# Patient Record
Sex: Female | Born: 1950 | Race: Black or African American | Hispanic: No | Marital: Single | Smoking: Former smoker
Health system: Southern US, Community
[De-identification: ages and names within clinical notes are randomized; demographics above are authoritative.]

## PROBLEM LIST (undated history)

## (undated) DIAGNOSIS — M79604 Pain in right leg: Secondary | ICD-10-CM

## (undated) DIAGNOSIS — D120 Benign neoplasm of cecum: Secondary | ICD-10-CM

## (undated) DIAGNOSIS — G589 Mononeuropathy, unspecified: Secondary | ICD-10-CM

## (undated) DIAGNOSIS — S149XXA Injury of unspecified nerves of neck, initial encounter: Secondary | ICD-10-CM

## (undated) DIAGNOSIS — E119 Type 2 diabetes mellitus without complications: Secondary | ICD-10-CM

## (undated) DIAGNOSIS — I10 Essential (primary) hypertension: Secondary | ICD-10-CM

## (undated) DIAGNOSIS — Z1211 Encounter for screening for malignant neoplasm of colon: Secondary | ICD-10-CM

## (undated) DIAGNOSIS — Z972 Presence of dental prosthetic device (complete) (partial): Secondary | ICD-10-CM

## (undated) DIAGNOSIS — D125 Benign neoplasm of sigmoid colon: Secondary | ICD-10-CM

## (undated) DIAGNOSIS — E785 Hyperlipidemia, unspecified: Secondary | ICD-10-CM

## (undated) HISTORY — DX: Encounter for screening for malignant neoplasm of colon: Z12.11

## (undated) HISTORY — PX: ABDOMINAL HYSTERECTOMY: SHX81

## (undated) HISTORY — DX: Benign neoplasm of cecum: D12.0

## (undated) HISTORY — DX: Benign neoplasm of sigmoid colon: D12.5

## (undated) HISTORY — DX: Essential (primary) hypertension: I10

## (undated) HISTORY — DX: Type 2 diabetes mellitus without complications: E11.9

## (undated) HISTORY — DX: Hyperlipidemia, unspecified: E78.5

---

## 2007-04-23 ENCOUNTER — Ambulatory Visit: Payer: Self-pay | Admitting: Internal Medicine

## 2007-04-24 ENCOUNTER — Ambulatory Visit: Payer: Self-pay | Admitting: Gastroenterology

## 2009-12-14 ENCOUNTER — Ambulatory Visit: Payer: Self-pay | Admitting: Obstetrics and Gynecology

## 2010-08-20 HISTORY — PX: ROTATOR CUFF REPAIR: SHX139

## 2011-01-10 ENCOUNTER — Ambulatory Visit: Payer: Self-pay | Admitting: Obstetrics and Gynecology

## 2014-02-09 ENCOUNTER — Ambulatory Visit: Payer: Self-pay

## 2014-02-26 ENCOUNTER — Ambulatory Visit: Payer: Self-pay

## 2014-10-12 ENCOUNTER — Ambulatory Visit: Payer: Self-pay | Admitting: Family Medicine

## 2014-12-03 ENCOUNTER — Ambulatory Visit
Admit: 2014-12-03 | Disposition: A | Discharge status: Home / Self Care | Payer: Self-pay | Attending: Orthopedic Surgery | Admitting: Orthopedic Surgery

## 2015-09-22 ENCOUNTER — Other Ambulatory Visit: Payer: Self-pay | Admitting: Family Medicine

## 2015-09-22 DIAGNOSIS — Z1231 Encounter for screening mammogram for malignant neoplasm of breast: Secondary | ICD-10-CM

## 2015-10-17 ENCOUNTER — Ambulatory Visit
Admission: RE | Admit: 2015-10-17 | Discharge: 2015-10-17 | Disposition: A | Discharge status: Home / Self Care | Payer: Medicare Other | Source: Ambulatory Visit | Attending: Family Medicine | Admitting: Family Medicine

## 2015-10-17 DIAGNOSIS — Z1231 Encounter for screening mammogram for malignant neoplasm of breast: Secondary | ICD-10-CM | POA: Diagnosis present

## 2016-03-21 ENCOUNTER — Other Ambulatory Visit: Payer: Self-pay

## 2016-03-21 ENCOUNTER — Telehealth: Payer: Self-pay | Admitting: Gastroenterology

## 2016-03-21 NOTE — Telephone Encounter (Signed)
Gastroenterology Pre-Procedure Review  Request Date: 05/18/2016 Requesting Physician: Dr. Gwynneth Aliment  PATIENT REVIEW QUESTIONS: The patient responded to the following health history questions as indicated:    1. Are you having any GI issues? no 2. Do you have a personal history of Polyps? no 3. Do you have a family history of Colon Cancer or Polyps? no 4. Diabetes Mellitus? no 5. Joint replacements in the past 12 months?no 6. Major health problems in the past 3 months?no 7. Any artificial heart valves, MVP, or defibrillator?no    MEDICATIONS & ALLERGIES:    Patient reports the following regarding taking any anticoagulation/antiplatelet therapy:   Plavix, Coumadin, Eliquis, Xarelto, Lovenox, Pradaxa, Brilinta, or Effient? no Aspirin? no  Patient confirms/reports the following medications:  Current Outpatient Prescriptions  Medication Sig Dispense Refill  . Blood Glucose Monitoring Suppl (ACCU-CHEK AVIVA PLUS) w/Device KIT TEST BLOOD SUGAR AS DIRECTED.  0  . hydrochlorothiazide (HYDRODIURIL) 25 MG tablet Take 25 mg by mouth daily.    [name] JANUVIA 50 MG tablet TK 1 T PO QAM  0  . meclizine (ANTIVERT) 25 MG tablet Take 25 mg by mouth 3 (three) times daily as needed for dizziness.    . meloxicam (MOBIC) 15 MG tablet Take 15 mg by mouth daily.    . simvastatin (ZOCOR) 10 MG tablet TK 1 T PO QAM  0  . ibuprofen (ADVIL,MOTRIN) 800 MG tablet TK 1 T PO Q 8 H WF PRF PAIN  0  . lisinopril (PRINIVIL,ZESTRIL) 10 MG tablet TK 1 T PO  QAM  0  . Vitamin D, Ergocalciferol, (DRISDOL) 50000 units CAPS capsule TK ONE C PO Q WEEK  3   No current facility-administered medications for this visit.     Patient confirms/reports the following allergies:  Allergies  Allergen Reactions  . Codeine Nausea And Vomiting    No orders of the defined types were placed in this encounter.   AUTHORIZATION INFORMATION Primary Insurance: 1D#: Group #:  Secondary Insurance: 1D#: Group #:  SCHEDULE  INFORMATION: Date: 05/18/2016 Time: Location: [facility]

## 2016-03-21 NOTE — Telephone Encounter (Signed)
colonoscopy

## 2016-03-21 NOTE — Telephone Encounter (Signed)
Screening Colonoscopy Z12.11 [facility] [id number] Please pre cert

## 2016-04-13 ENCOUNTER — Encounter: Payer: Medicare Other | Attending: Obstetrics and Gynecology | Admitting: *Deleted

## 2016-04-13 ENCOUNTER — Encounter: Payer: Self-pay | Admitting: *Deleted

## 2016-04-13 VITALS — BP 148/90 | Ht 64.0 in | Wt 180.6 lb

## 2016-04-13 DIAGNOSIS — Z713 Dietary counseling and surveillance: Secondary | ICD-10-CM | POA: Insufficient documentation

## 2016-04-13 DIAGNOSIS — E119 Type 2 diabetes mellitus without complications: Secondary | ICD-10-CM

## 2016-04-13 DIAGNOSIS — Z6831 Body mass index (BMI) 31.0-31.9, adult: Secondary | ICD-10-CM | POA: Insufficient documentation

## 2016-04-13 NOTE — Progress Notes (Signed)
Diabetes Self-Management Education  Visit Type: First/Initial  Appt. Start Time: 1400 Appt. End Time: 1500  04/13/2016  [name], identified by name and date of birth, is a 65 y.o. female with a diagnosis of Diabetes: Type 2.   ASSESSMENT  Blood pressure (!) 148/90, height 5\' 4"  (1.626 m), weight 180 lb 9.6 oz (81.9 kg). Body mass index is 31 kg/m.      Diabetes Self-Management Education - 04/13/16 1523      Visit Information   Visit Type First/Initial     Initial Visit   Diabetes Type Type 2   Are you currently following a meal plan? Yes   What type of meal plan do you follow? "no soda, no starchy foods, only no sugar wheat bread"   Are you taking your medications as prescribed? No  Pt reports that she has "diabetic" symptoms when taking diabetes medications. She is not taking Januvia that is on her medication list.    Date Diagnosed Pt reports diabetes was on her chart years ago but she had them remove as diagnosis.      Health Coping   How would you rate your overall health? Good     Psychosocial Assessment   Patient Belief/Attitude about Diabetes Denial  "I am determined I am not diabetic"   Self-care barriers None   Self-management support Doctor's office;Family   Patient Concerns Nutrition/Meal planning;Medication;Glycemic Control;Problem Solving;Monitoring;Healthy Lifestyle;Weight Control   Special Needs None   Preferred Learning Style Visual   Learning Readiness Change in progress   How often do you need to have someone help you when you read instructions, pamphlets, or other written materials from your doctor or pharmacy? 1 - Never   What is the last grade level you completed in school? 9th grade but is currently getting her GED     Pre-Education Assessment   Patient understands the diabetes disease and treatment process. Needs Instruction   Patient understands incorporating nutritional management into lifestyle. Needs Instruction   Patient undertands  incorporating physical activity into lifestyle. Needs Instruction   Patient understands using medications safely. Needs Instruction   Patient understands monitoring blood glucose, interpreting and using results Needs Review   Patient understands prevention, detection, and treatment of acute complications. Needs Instruction   Patient understands prevention, detection, and treatment of chronic complications. Needs Instruction   Patient understands how to develop strategies to address psychosocial issues. Needs Instruction   Patient understands how to develop strategies to promote health/change behavior. Needs Instruction     Complications   How often do you check your blood sugar? 1-2 times/day   Postprandial Blood glucose range (mg/dL) 130-179;180-200   Have you had a dilated eye exam in the past 12 months? Yes   Have you had a dental exam in the past 12 months? Yes   Are you checking your feet? No     Dietary Intake   Breakfast oatmeal, 1/2 banana or egg   Snack (morning) cheese nabs   Lunch baked chicken on fish, 3 bean salad, squash, green beans   Dinner same as lunch   Beverage(s) water, unsweetened tea, coffee     Exercise   Exercise Type Light (walking / raking leaves)   How many days per week to you exercise? 3   How many minutes per day do you exercise? 30   Total minutes per week of exercise 90     Patient Education   Previous Diabetes Education No   Disease state  Factors  that contribute to the development of diabetes   Nutrition management  Role of diet in the treatment of diabetes and the relationship between the three main macronutrients and blood glucose level   Physical activity and exercise  Role of exercise on diabetes management, blood pressure control and cardiac health.   Monitoring Purpose and frequency of SMBG.;Identified appropriate SMBG and/or A1C goals.   Chronic complications Relationship between chronic complications and blood glucose control    Psychosocial adjustment Identified and addressed patients feelings and concerns about diabetes     Individualized Goals (developed by patient)   Reducing Risk Improve blood sugars Decrease medications Prevent diabetes complications Lose weight Lead a healthier lifestyle     Outcomes   Expected Outcomes Demonstrated interest in learning. Expect positive outcomes   Future DMSE 2 wks      Individualized Plan for Diabetes Self-Management Training:   Learning Objective:  Patient will have a greater understanding of diabetes self-management. Patient education plan is to attend individual and/or group sessions per assessed needs and concerns.   Plan:   Patient Instructions  Check blood sugars 2 x day before breakfast and 2 hrs after supper every day Exercise: Continue walking for 30 minutes  3 days a week and increase to 5 days a week as tolerated Eat 3 meals day,  1-2  snacks a day Space meals 4-6 hours apart Don't skip meals Complete 3 Day Food Record and bring to next appt Bring blood sugar records to the next appointment Return for appointment on:  Tuesday May 01, 2016 at 10:30 am with [facility] (dietitian)   Expected Outcomes:  Demonstrated interest in learning. Expect positive outcomes  Education material provided:  General Meal Planning Guidelines Simple Meal Plan 3 Day Food Record  If problems or questions, patient to contact team via:  Johny Drilling, RN, [place], CDE [phone]  Future DSME appointment: 2 wks  Tuesday May 01, 2016 with dietitian

## 2016-04-13 NOTE — Patient Instructions (Addendum)
Check blood sugars 2 x day before breakfast and 2 hrs after supper every day  Exercise: Continue walking for 30 minutes  3 days a week and increase to 5 days a week as tolerated  Eat 3 meals day,  1-2  snacks a day Space meals 4-6 hours apart Don't skip meals Complete 3 Day Food Record and bring to next appt  Bring blood sugar records to the next appointment  Return for appointment on:  Tuesday May 01, 2016 at 10:30 am with [facility] (dietitian)

## 2016-05-01 ENCOUNTER — Ambulatory Visit: Payer: Medicare Other | Admitting: Dietician

## 2016-05-08 ENCOUNTER — Ambulatory Visit: Payer: Medicare Other | Admitting: Dietician

## 2016-05-10 ENCOUNTER — Encounter: Payer: Self-pay | Admitting: *Deleted

## 2016-05-14 ENCOUNTER — Encounter: Payer: Medicare Other | Attending: Obstetrics and Gynecology | Admitting: Dietician

## 2016-05-14 ENCOUNTER — Encounter: Payer: Self-pay | Admitting: Dietician

## 2016-05-14 VITALS — BP 136/90 | Ht 64.0 in | Wt 178.0 lb

## 2016-05-14 DIAGNOSIS — Z6831 Body mass index (BMI) 31.0-31.9, adult: Secondary | ICD-10-CM | POA: Diagnosis not present

## 2016-05-14 DIAGNOSIS — Z713 Dietary counseling and surveillance: Secondary | ICD-10-CM | POA: Diagnosis present

## 2016-05-14 DIAGNOSIS — E119 Type 2 diabetes mellitus without complications: Secondary | ICD-10-CM | POA: Diagnosis not present

## 2016-05-14 NOTE — Patient Instructions (Signed)
   Try to include 2 servings of healthy carbohydrate with each meal.   Eat something at least every 5 hours during the day.   You are doing a great job eating vegetables and lean protein foods, keep it up!  Also keep up your regular exercise.   Remember that not enough sleep, and pain can both make your blood sugar go up, so doing what you can to get enough sleep and control your pain will help keep blood sugar in control.

## 2016-05-14 NOTE — Progress Notes (Signed)
Diabetes Self-Management Education  Visit Type:  Follow-up  Appt. Start Time: 1330 Appt. End Time: 1440  05/14/2016  [name], identified by name and date of birth, is a 65 y.o. female with a diagnosis of Diabetes:  .   ASSESSMENT  Blood pressure 136/90, height 5\' 4"  (1.626 m), weight 178 lb (80.7 kg). Body mass index is 30.55 kg/m.       Diabetes Self-Management Education - [id number]      Complications   How often do you check your blood sugar? 1-2 times/day  2-3 times a day   Fasting Blood glucose range (mg/dL) 70-129;130-179   Postprandial Blood glucose range (mg/dL) 130-179;70-129   Have you had a dilated eye exam in the past 12 months? Yes   Have you had a dental exam in the past 12 months? Yes   Are you checking your feet? Yes   How many days per week are you checking your feet? 7     Dietary Intake   Breakfast 2 meals and 1-3 snacks daily     Exercise   Exercise Type Light (walking / raking leaves)   How many days per week to you exercise? 3   How many minutes per day do you exercise? 30   Total minutes per week of exercise 90     Patient Education   Nutrition management  Role of diet in the treatment of diabetes and the relationship between the three main macronutrients and blood glucose level;Food label reading, portion sizes and measuring food.;Meal timing in regards to the patients' current diabetes medication.;Meal options for control of blood glucose level and chronic complications.   Physical activity and exercise  Role of exercise on diabetes management, blood pressure control and cardiac health.   Monitoring Taught/discussed recording of test results and interpretation of SMBG.   Psychosocial adjustment Role of stress on diabetes     Post-Education Assessment   Patient understands the diabetes disease and treatment process. Demonstrates understanding / competency   Patient understands incorporating nutritional management into lifestyle. Needs  Review   Patient undertands incorporating physical activity into lifestyle. Demonstrates understanding / competency   Patient understands using medications safely. Needs Review  needs reinforcement   Patient understands monitoring blood glucose, interpreting and using results Demonstrates understanding / competency   Patient understands prevention, detection, and treatment of acute complications. Needs Review   Patient understands prevention, detection, and treatment of chronic complications. Needs Review   Patient understands how to develop strategies to address psychosocial issues. Needs Review   Patient understands how to develop strategies to promote health/change behavior. Needs Review     Outcomes   Program Status Completed      Learning Objective:  Patient will have a greater understanding of diabetes self-management. Patient education plan is to attend individual and/or group sessions per assessed needs and concerns.  Patient has been inconsistent with carbohydrate intake in effort to avoid carbohydrate with some meals. Advised her to include at least 2 carb servings, or 30g, with each meal.  She has also stopped taking most meds because she feels they are giving her side effects, and wants to avoid taking meds if possible. She is taking Simvastatin, B12, and Vitamin D, and Meclizine as needed. Plan:   Patient Instructions   Try to include 2 servings of healthy carbohydrate with each meal.   Eat something at least every 5 hours during the day.   You are doing a great job eating vegetables and lean  protein foods, keep it up!  Also keep up your regular exercise.   Remember that not enough sleep, and pain can both make your blood sugar go up, so doing what you can to get enough sleep and control your pain will help keep blood sugar in control.    Expected Outcomes:  Demonstrated interest in learning. Expect positive outcomes  Education material provided: personal Food  record with adequate healthy carbs for balanced meal options.   If problems or questions, patient to contact team via:  Phone

## 2016-05-15 NOTE — Discharge Instructions (Signed)

## 2016-05-17 ENCOUNTER — Ambulatory Visit: Payer: Medicare Other | Admitting: Anesthesiology

## 2016-05-17 ENCOUNTER — Encounter: Payer: Self-pay | Admitting: *Deleted

## 2016-05-17 ENCOUNTER — Ambulatory Visit: Payer: Medicare Other | Admitting: Dietician

## 2016-05-17 ENCOUNTER — Ambulatory Visit
Admission: RE | Admit: 2016-05-17 | Discharge: 2016-05-17 | Disposition: A | Discharge status: Home / Self Care | Payer: Medicare Other | Source: Ambulatory Visit | Attending: Gastroenterology | Admitting: Gastroenterology

## 2016-05-17 ENCOUNTER — Encounter
Admission: RE | Disposition: A | Discharge status: Home / Self Care | Payer: Self-pay | Source: Ambulatory Visit | Attending: Gastroenterology

## 2016-05-17 DIAGNOSIS — K641 Second degree hemorrhoids: Secondary | ICD-10-CM | POA: Diagnosis not present

## 2016-05-17 DIAGNOSIS — Z7984 Long term (current) use of oral hypoglycemic drugs: Secondary | ICD-10-CM | POA: Diagnosis not present

## 2016-05-17 DIAGNOSIS — Z833 Family history of diabetes mellitus: Secondary | ICD-10-CM | POA: Diagnosis not present

## 2016-05-17 DIAGNOSIS — I10 Essential (primary) hypertension: Secondary | ICD-10-CM | POA: Insufficient documentation

## 2016-05-17 DIAGNOSIS — Z90710 Acquired absence of both cervix and uterus: Secondary | ICD-10-CM | POA: Diagnosis not present

## 2016-05-17 DIAGNOSIS — Z885 Allergy status to narcotic agent status: Secondary | ICD-10-CM | POA: Insufficient documentation

## 2016-05-17 DIAGNOSIS — Z87891 Personal history of nicotine dependence: Secondary | ICD-10-CM | POA: Insufficient documentation

## 2016-05-17 DIAGNOSIS — E119 Type 2 diabetes mellitus without complications: Secondary | ICD-10-CM | POA: Diagnosis not present

## 2016-05-17 DIAGNOSIS — Z79899 Other long term (current) drug therapy: Secondary | ICD-10-CM | POA: Insufficient documentation

## 2016-05-17 DIAGNOSIS — D120 Benign neoplasm of cecum: Secondary | ICD-10-CM

## 2016-05-17 DIAGNOSIS — Z6829 Body mass index (BMI) 29.0-29.9, adult: Secondary | ICD-10-CM | POA: Insufficient documentation

## 2016-05-17 DIAGNOSIS — G588 Other specified mononeuropathies: Secondary | ICD-10-CM | POA: Diagnosis not present

## 2016-05-17 DIAGNOSIS — M79604 Pain in right leg: Secondary | ICD-10-CM | POA: Insufficient documentation

## 2016-05-17 DIAGNOSIS — E785 Hyperlipidemia, unspecified: Secondary | ICD-10-CM | POA: Insufficient documentation

## 2016-05-17 DIAGNOSIS — D125 Benign neoplasm of sigmoid colon: Secondary | ICD-10-CM

## 2016-05-17 DIAGNOSIS — Z1211 Encounter for screening for malignant neoplasm of colon: Secondary | ICD-10-CM

## 2016-05-17 HISTORY — DX: Injury of unspecified nerves of neck, initial encounter: S14.9XXA

## 2016-05-17 HISTORY — DX: Pain in right leg: M79.604

## 2016-05-17 HISTORY — PX: COLONOSCOPY WITH PROPOFOL: SHX5780

## 2016-05-17 HISTORY — DX: Presence of dental prosthetic device (complete) (partial): Z97.2

## 2016-05-17 HISTORY — DX: Mononeuropathy, unspecified: G58.9

## 2016-05-17 HISTORY — PX: POLYPECTOMY: SHX5525

## 2016-05-17 SURGERY — COLONOSCOPY WITH PROPOFOL
Anesthesia: Monitor Anesthesia Care | Wound class: Contaminated

## 2016-05-17 MED ORDER — LACTATED RINGERS IV SOLN
INTRAVENOUS | Status: DC
  Administered 2016-05-17: 10:00:00 via INTRAVENOUS

## 2016-05-17 MED ORDER — SODIUM CHLORIDE 0.9 % IV SOLN: INTRAVENOUS | Status: DC

## 2016-05-17 MED ORDER — STERILE WATER FOR IRRIGATION IR SOLN
Status: DC | PRN
  Administered 2016-05-17: 12:00:00

## 2016-05-17 MED ORDER — LIDOCAINE HCL (CARDIAC) 20 MG/ML IV SOLN
INTRAVENOUS | Status: DC | PRN
  Administered 2016-05-17: 50 mg via INTRAVENOUS

## 2016-05-17 MED ORDER — PROPOFOL 10 MG/ML IV BOLUS
INTRAVENOUS | Status: DC | PRN
  Administered 2016-05-17: 20 mg via INTRAVENOUS
  Administered 2016-05-17: 50 mg via INTRAVENOUS
  Administered 2016-05-17 (×2): 30 mg via INTRAVENOUS
  Administered 2016-05-17: 20 mg via INTRAVENOUS

## 2016-05-17 SURGICAL SUPPLY — 23 items
CANISTER SUCT 1200ML W/VALVE (MISCELLANEOUS) ×3 IMPLANT
CLIP HMST 235XBRD CATH ROT (MISCELLANEOUS) IMPLANT
CLIP RESOLUTION 360 11X235 (MISCELLANEOUS)
FCP ESCP3.2XJMB 240X2.8X (MISCELLANEOUS)
FORCEPS BIOP RAD 4 LRG CAP 4 (CUTTING FORCEPS) ×3 IMPLANT
FORCEPS BIOP RJ4 240 W/NDL (MISCELLANEOUS)
FORCEPS ESCP3.2XJMB 240X2.8X (MISCELLANEOUS) IMPLANT
GOWN CVR UNV OPN BCK APRN NK (MISCELLANEOUS) ×4 IMPLANT
GOWN ISOL THUMB LOOP REG UNIV (MISCELLANEOUS) ×2
INJECTOR VARIJECT VIN23 (MISCELLANEOUS) IMPLANT
KIT DEFENDO VALVE AND CONN (KITS) IMPLANT
KIT ENDO PROCEDURE OLY (KITS) ×3 IMPLANT
MARKER SPOT ENDO TATTOO 5ML (MISCELLANEOUS) IMPLANT
PAD GROUND ADULT SPLIT (MISCELLANEOUS) IMPLANT
PROBE APC STR FIRE (PROBE) IMPLANT
RETRIEVER NET ROTH 2.5X230 LF (MISCELLANEOUS) IMPLANT
SNARE SHORT THROW 13M SML OVAL (MISCELLANEOUS) ×3 IMPLANT
SNARE SHORT THROW 30M LRG OVAL (MISCELLANEOUS) IMPLANT
SNARE SNG USE RND 15MM (INSTRUMENTS) IMPLANT
SPOT EX ENDOSCOPIC TATTOO (MISCELLANEOUS)
TRAP ETRAP POLY (MISCELLANEOUS) ×3 IMPLANT
VARIJECT INJECTOR VIN23 (MISCELLANEOUS)
WATER STERILE IRR 250ML POUR (IV SOLUTION) ×3 IMPLANT

## 2016-05-17 NOTE — Anesthesia Postprocedure Evaluation (Signed)
Anesthesia Post Note  Patient: Valerie Prince  Procedure(s) Performed: Procedure(s) (LRB): COLONOSCOPY WITH PROPOFOL (N/A) POLYPECTOMY  Patient location during evaluation: PACU Anesthesia Type: MAC Level of consciousness: awake and alert Pain management: pain level controlled Vital Signs Assessment: post-procedure vital signs reviewed and stable Respiratory status: spontaneous breathing, nonlabored ventilation, respiratory function stable and patient connected to nasal cannula oxygen Cardiovascular status: stable and blood pressure returned to baseline Anesthetic complications: no    Cheick Suhr

## 2016-05-17 NOTE — H&P (Signed)
Valerie Lame, MD [facility] [address], [place], [place] [address] Phone: [phone] Fax : [phone]  Primary Care Physician:  Elgie Collard, MD Primary Gastroenterologist:  Dr. Allen Norris  Pre-Procedure History & Physical: HPI:  Valerie Prince is a 65 y.o. female is here for a screening colonoscopy.   Past Medical History:  Diagnosis Date  . Diabetes mellitus without complication ([place])    pt denies  . Hyperlipidemia   . Hypertension    pt denies  . Pinched nerve in neck   . Right leg pain    intermittent  . Wears dentures    upper partial    Past Surgical History:  Procedure Laterality Date  . ABDOMINAL HYSTERECTOMY    . ROTATOR CUFF REPAIR Left 2012    Prior to Admission medications   Medication Sig Start Date End Date Taking? Authorizing Provider  azelaic acid (AZELEX) 20 % cream Apply 1 application topically at bedtime. 10/26/15  Yes Historical Provider, MD  ibuprofen (ADVIL,MOTRIN) 800 MG tablet TK 1 T PO Q 8 H WF PRF PAIN 12/20/15  Yes Historical Provider, MD  meclizine (ANTIVERT) 25 MG tablet Take 25 mg by mouth 3 (three) times daily as needed for dizziness.   Yes Historical Provider, MD  simvastatin (ZOCOR) 10 MG tablet TK 1 T PO QAM 02/28/16  Yes Historical Provider, MD  vitamin B-12 (CYANOCOBALAMIN) 1000 MCG tablet Take 1,000 mcg by mouth daily.   Yes Historical Provider, MD  Vitamin D, Ergocalciferol, (DRISDOL) 50000 units CAPS capsule TK ONE C PO Q WEEK 02/28/16  Yes Historical Provider, MD  Blood Glucose Monitoring Suppl (ACCU-CHEK AVIVA PLUS) w/Device KIT TEST BLOOD SUGAR AS DIRECTED. 02/28/16   Historical Provider, MD  cyclobenzaprine (FLEXERIL) 10 MG tablet Take 10 mg by mouth 3 (three) times daily as needed. 01/21/16   Historical Provider, MD  hydrochlorothiazide (HYDRODIURIL) 25 MG tablet Take 25 mg by mouth daily as needed.     Historical Provider, MD  JANUVIA 50 MG tablet TK 1 T PO QAM 03/01/16   Historical Provider, MD  lisinopril (PRINIVIL,ZESTRIL) 10  MG tablet TK 1 T PO  QAM 02/28/16   Historical Provider, MD  meloxicam (MOBIC) 15 MG tablet Take 15 mg by mouth daily as needed.     Historical Provider, MD    Allergies as of 03/21/2016 - Review Complete 03/21/2016  Allergen Reaction Noted  . Codeine Nausea And Vomiting 03/21/2016    Family History  Problem Relation Age of Onset  . Diabetes Sister     Social History   Social History  . Marital status: Married    Spouse name: N/A  . Number of children: N/A  . Years of education: N/A   Occupational History  . Not on file.   Social History Main Topics  . Smoking status: Former Smoker    Packs/day: 0.50    Years: 32.00    Types: Cigarettes    Quit date: 03/20/2006  . Smokeless tobacco: Never Used  . Alcohol use Yes     Comment: 3 x year - wine  . Drug use: Unknown  . Sexual activity: Not on file   Other Topics Concern  . Not on file   Social History Narrative  . No narrative on file    Review of Systems: See HPI, otherwise negative ROS  Physical Exam: BP 130/87   Pulse (!) 102   Temp 97.4 F (36.3 C) (Core (Comment))   Resp 20   Ht _0  (1.626 m)  Wt 173 lb (78.5 kg)   SpO2 100%   BMI 29.70 kg/m  General:   Alert,  pleasant and cooperative in NAD Head:  Normocephalic and atraumatic. Neck:  Supple; no masses or thyromegaly. Lungs:  Clear throughout to auscultation.    Heart:  Regular rate and rhythm. Abdomen:  Soft, nontender and nondistended. Normal bowel sounds, without guarding, and without rebound.   Neurologic:  Alert and  oriented x4;  grossly normal neurologically.  Impression/Plan: Valerie Prince is now here to undergo a screening colonoscopy.  Risks, benefits, and alternatives regarding colonoscopy have been reviewed with the patient.  Questions have been answered.  All parties agreeable.

## 2016-05-17 NOTE — Anesthesia Procedure Notes (Signed)
Procedure Name: MAC Performed by: Izaias Krupka Pre-anesthesia Checklist: Patient identified, Emergency Drugs available, Suction available, Timeout performed and Patient being monitored Patient Re-evaluated:Patient Re-evaluated prior to inductionOxygen Delivery Method: Nasal cannula Placement Confirmation: positive ETCO2       

## 2016-05-17 NOTE — Anesthesia Preprocedure Evaluation (Signed)
Anesthesia Evaluation  Patient identified by MRN, date of birth, ID band  Reviewed: NPO status   History of Anesthesia Complications Negative for: history of anesthetic complications  Airway Mallampati: II  TM Distance: >3 FB Neck ROM: full    Dental  (+) Upper Dentures   Pulmonary neg pulmonary ROS, former smoker,    Pulmonary exam normal        Cardiovascular Exercise Tolerance: Good hypertension, Normal cardiovascular exam  lipids   Neuro/Psych negative neurological ROS  negative psych ROS   GI/Hepatic negative GI ROS, Neg liver ROS,   Endo/Other  diabetes (diet ctrl)Morbid obesity (bmi=31)  Renal/GU negative Renal ROS  negative genitourinary   Musculoskeletal   Abdominal   Peds  Hematology negative hematology ROS (+)   Anesthesia Other Findings   Reproductive/Obstetrics                             Anesthesia Physical Anesthesia Plan  ASA: II  Anesthesia Plan: MAC   Post-op Pain Management:    Induction:   Airway Management Planned:   Additional Equipment:   Intra-op Plan:   Post-operative Plan:   Informed Consent: I have reviewed the patients History and Physical, chart, labs and discussed the procedure including the risks, benefits and alternatives for the proposed anesthesia with the patient or authorized representative who has indicated his/her understanding and acceptance.     Plan Discussed with: CRNA  Anesthesia Plan Comments:         Anesthesia Quick Evaluation

## 2016-05-17 NOTE — Transfer of Care (Signed)
Immediate Anesthesia Transfer of Care Note  Patient: Valerie Prince  Procedure(s) Performed: Procedure(s) with comments: COLONOSCOPY WITH PROPOFOL (N/A) - PLEASE KEEP PT ARRIVAL TIME 10 OR AFTER POLYPECTOMY  Patient Location: PACU  Anesthesia Type: MAC  Level of Consciousness: awake, alert  and patient cooperative  Airway and Oxygen Therapy: Patient Spontanous Breathing and Patient connected to supplemental oxygen  Post-op Assessment: Post-op Vital signs reviewed, Patient's Cardiovascular Status Stable, Respiratory Function Stable, Patent Airway and No signs of Nausea or vomiting  Post-op Vital Signs: Reviewed and stable  Complications: No apparent anesthesia complications

## 2016-05-17 NOTE — Op Note (Signed)
[facility]astro[facility] Patient Name: Valerie Prince Procedure Date: 05/17/2016 11:18 AM MRN: [id number] Account #: [id number] Date of Birth: 08-25-1950 Admit Type: Outpatient Age: 65 Room: [facility] OR ROOM 01 Gender: Female Note Status: Finalized Procedure:            Colonoscopy Indications:          Screening for colorectal malignant neoplasm Providers:            [name] MD, MD Referring MD:         Shelby Mattocks. Georga Bora, MD (Referring MD) Medicines:            Propofol per Anesthesia Complications:        No immediate complications. Procedure:            Pre-Anesthesia Assessment:                       - Prior to the procedure, a History and Physical was                        performed, and patient medications and allergies were                        reviewed. The patient's tolerance of previous                        anesthesia was also reviewed. The risks and benefits of                        the procedure and the sedation options and risks were                        discussed with the patient. All questions were                        answered, and informed consent was obtained. Prior                        Anticoagulants: The patient has taken no previous                        anticoagulant or antiplatelet agents. ASA Grade                        Assessment: II - A patient with mild systemic disease.                        After reviewing the risks and benefits, the patient was                        deemed in satisfactory condition to undergo the                        procedure.                       After obtaining informed consent, the colonoscope was                        passed under direct vision. Throughout the procedure,  the patient's blood pressure, pulse, and oxygen                        saturations were monitored continuously. The Olympus CF                        H180AL colonoscope (S#: [id number]) was  introduced through                        the anus and advanced to the the cecum, identified by                        appendiceal orifice and ileocecal valve. The                        colonoscopy was performed without difficulty. The                        patient tolerated the procedure well. The quality of                        the bowel preparation was excellent. Findings:      The perianal and digital rectal examinations were normal.      A 4 mm polyp was found in the cecum. The polyp was sessile. The polyp       was removed with a cold biopsy forceps. Resection and retrieval were       complete.      A 6 mm polyp was found in the sigmoid colon. The polyp was sessile. The       polyp was removed with a cold snare. Resection and retrieval were       complete.      Non-bleeding internal hemorrhoids were found during retroflexion. The       hemorrhoids were Grade II (internal hemorrhoids that prolapse but reduce       spontaneously). Impression:           - One 4 mm polyp in the cecum, removed with a cold                        biopsy forceps. Resected and retrieved.                       - One 6 mm polyp in the sigmoid colon, removed with a                        cold snare. Resected and retrieved.                       - Non-bleeding internal hemorrhoids. Recommendation:       - Discharge patient to home.                       - Resume previous diet.                       - Continue present medications.                       - Await pathology results.                       -  Repeat colonoscopy in 5 years if polyp adenoma and 10                        years if hyperplastic Procedure Code(s):    --- Professional ---                       [phone], Colonoscopy, flexible; with removal of tumor(s),                        polyp(s), or other lesion(s) by snare technique                       45380, 65, Colonoscopy, flexible; with biopsy, single                        or  multiple Diagnosis Code(s):    --- Professional ---                       Z12.11, Encounter for screening for malignant neoplasm                        of colon                       D12.0, Benign neoplasm of cecum                       D12.5, Benign neoplasm of sigmoid colon CPT copyright 2016 American Medical Association. All rights reserved. The codes documented in this report are preliminary and upon coder review may  be revised to meet current compliance requirements. [name] MD, MD 05/17/2016 11:44:03 AM This report has been signed electronically. Number of Addenda: 0 Note Initiated On: 05/17/2016 11:18 AM Scope Withdrawal Time: 0 hours 6 minutes 26 seconds  Total Procedure Duration: 0 hours 10 minutes 32 seconds       [facility]

## 2016-05-18 ENCOUNTER — Encounter: Payer: Self-pay | Admitting: Gastroenterology

## 2016-05-24 ENCOUNTER — Encounter: Payer: Self-pay | Admitting: Gastroenterology

## 2016-05-29 ENCOUNTER — Encounter: Payer: Self-pay | Admitting: Gastroenterology

## 2017-02-23 IMAGING — MR MRI LUMBAR SPINE WITHOUT CONTRAST
5 series · 37 of 48 positions shown · non-contrast
Comparison: Lumbar radiographs 10/12/2014.

CLINICAL DATA: Low back pain. Radiation to the RIGHT leg. Symptoms
for over 1 year.

EXAM:
MRI LUMBAR SPINE WITHOUT CONTRAST
TECHNIQUE: Multiplanar, multisequence MR imaging of the lumbar spine was
performed. No intravenous contrast was administered.

[Series 2: T2 · sagittal · 4.0mm · 0.81mm/px · 6 of 15 slices shown (1 of 2)]
[im 1/15]
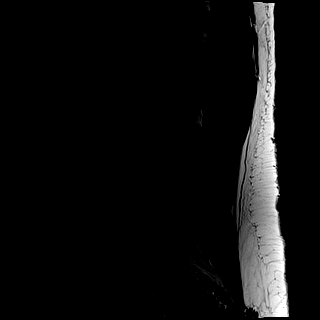
[im 3/15]
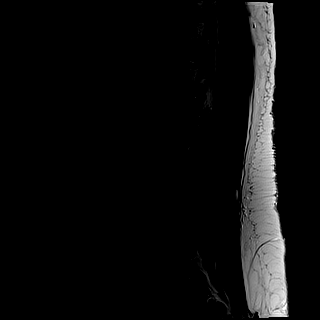
[im 6/15]
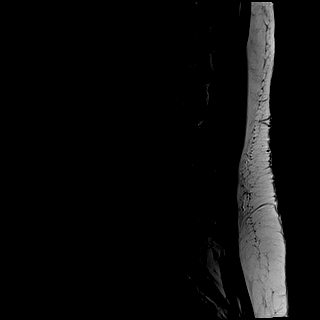
[im 9/15]
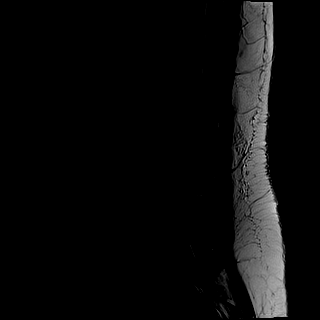
[im 12/15]
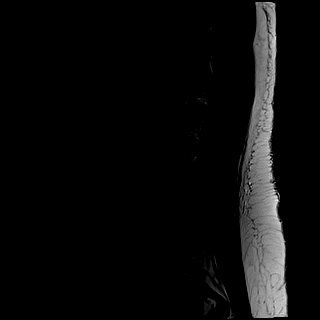
[im 15/15]
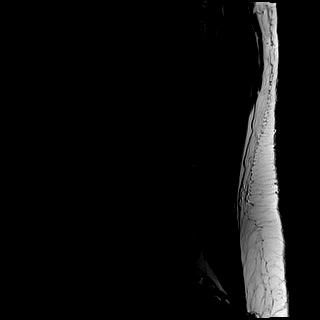

[Series 3: T1 · sagittal · 4.0mm · 0.81mm/px · 6 of 15 slices shown (1 of 2)]
[im 1/15]
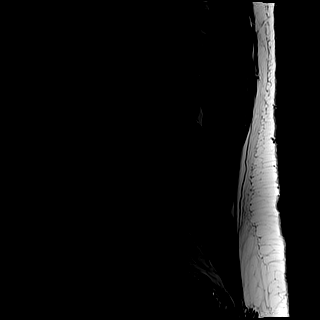
[im 3/15]
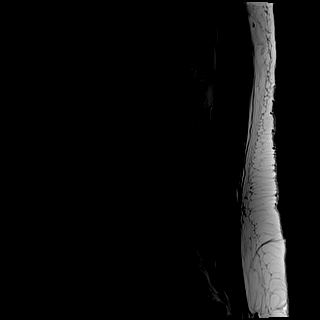
[im 6/15]
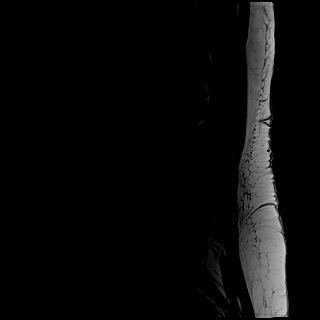
[im 9/15]
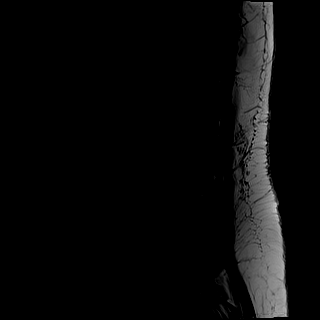
[im 12/15]
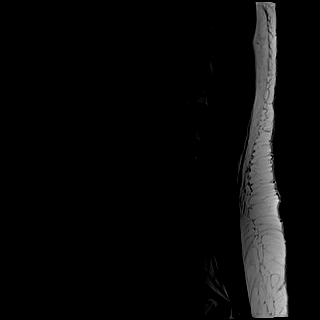
[im 15/15]
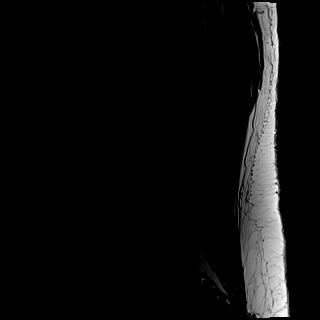

[Series 4: STIR · sagittal · 4.0mm · 1.02mm/px · 6 of 15 slices shown]
[im 1/15]
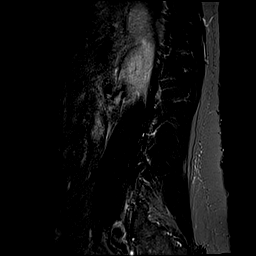
[im 3/15]
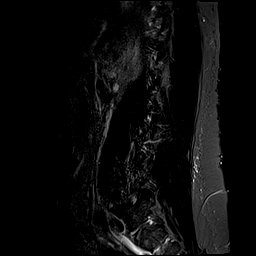
[im 6/15]
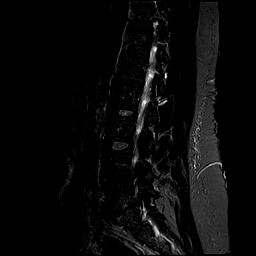
[im 9/15]
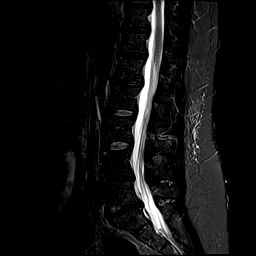
[im 12/15]
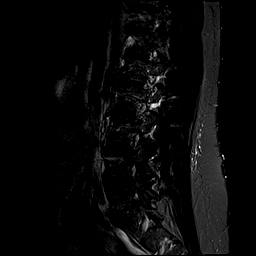
[im 15/15]
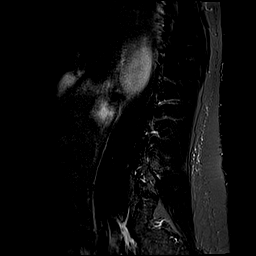

[Series 5: T2 · axial · 4.0mm · 0.78mm/px · z∈[-36,+157]mm · 10 of 39 slices shown (2 of 2)]
[im 1/39]
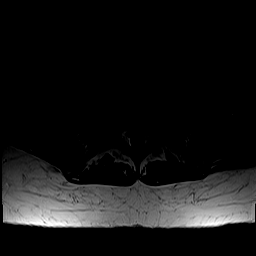
[im 3/39]
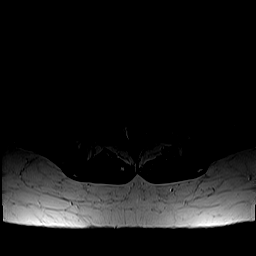
[im 6/39]
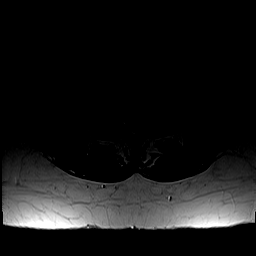
[im 11/39]
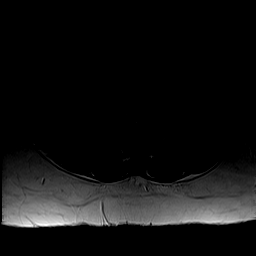
[im 17/39]
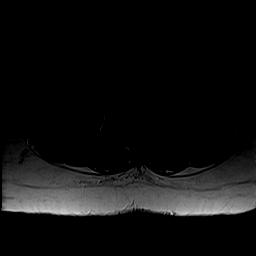
[im 20/39]
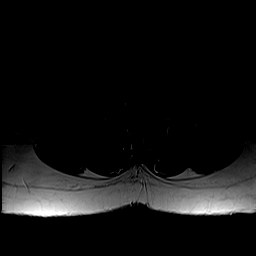
[im 22/39]
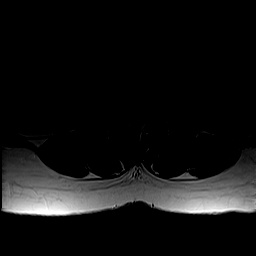
[im 28/39]
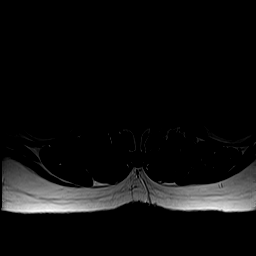
[im 33/39]
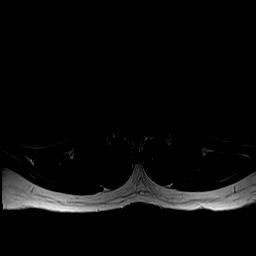
[im 39/39]
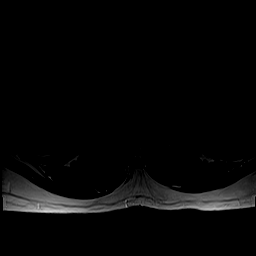

[Series 6: T1 · axial · 4.0mm · 0.39mm/px · z∈[-36,+157]mm · 9 of 39 slices shown (2 of 2)]
[im 1/39]
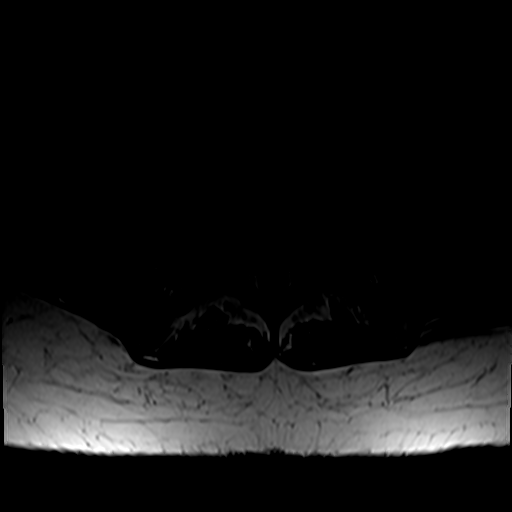
[im 6/39]
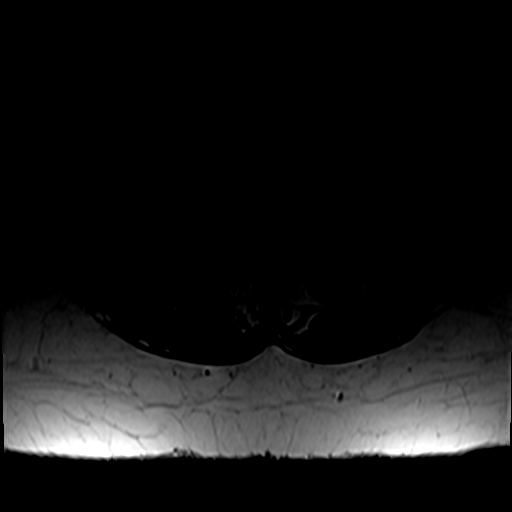
[im 11/39]
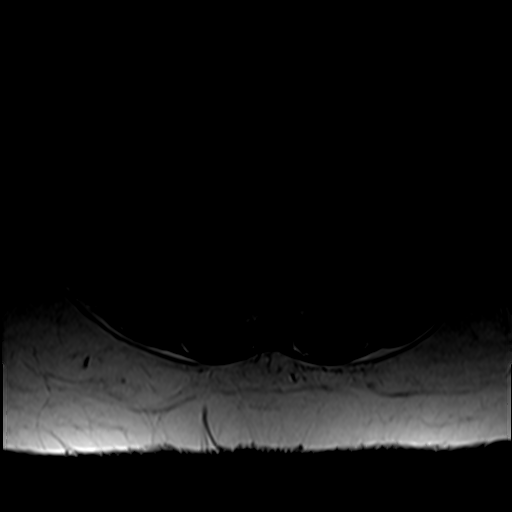
[im 17/39]
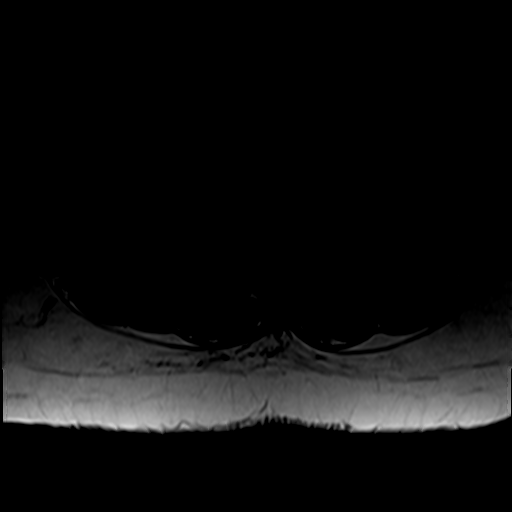
[im 20/39]
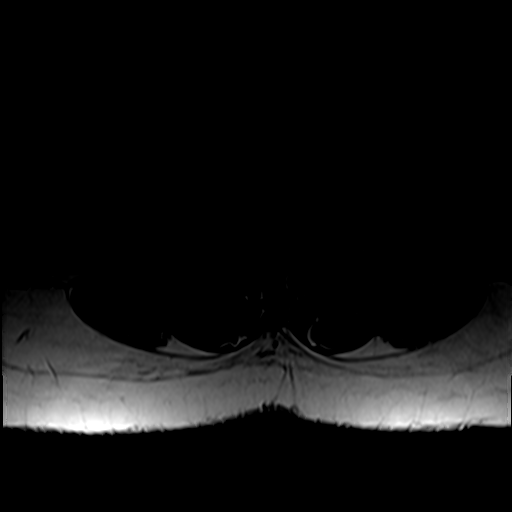
[im 22/39]
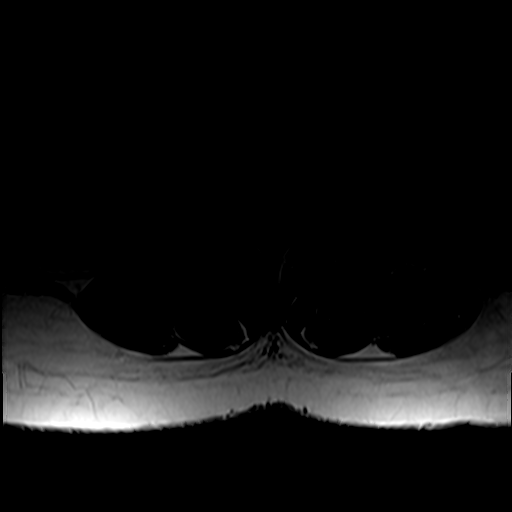
[im 28/39]
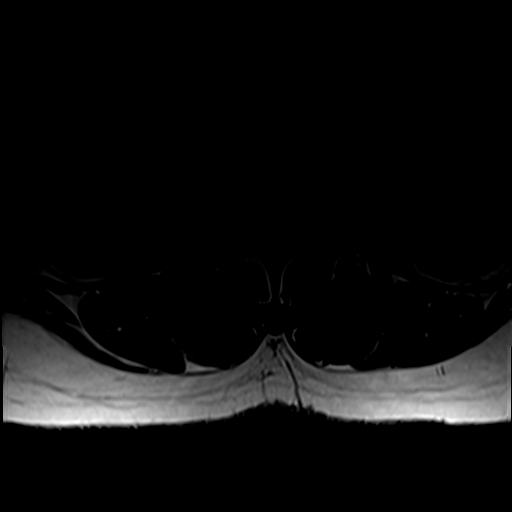
[im 33/39]
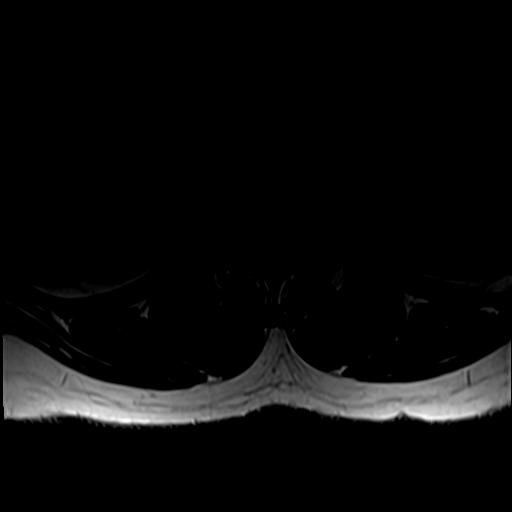
[im 39/39]
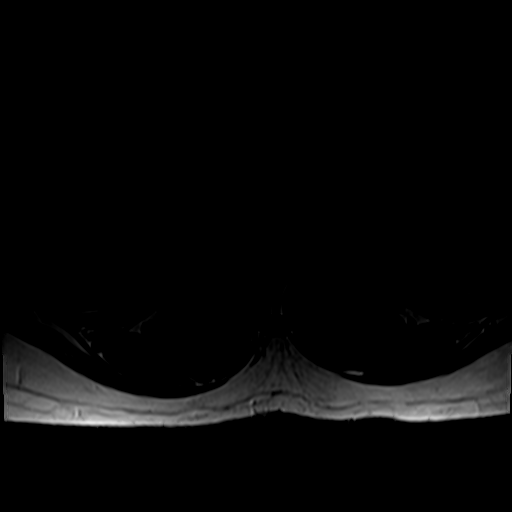

[37 of 48 positions shown; findings below may reference images not displayed]

FINDINGS: Segmentation: Normal.

Alignment:  Normal.

Vertebrae: No worrisome osseous lesion.Minor endplate reactive
changes of a chronic nature particularly L5-S1.

Conus medullaris: Normal in size, signal, and location.

Paraspinal tissues: No evidence for hydronephrosis or paravertebral
mass. Slightly greater than 1 cm LEFT parapelvic cyst incompletely
evaluated.

Disc levels:

L1-L2:  Mild bulge.  No impingement.

L2-L3:  Unremarkable.

L3-L4:  Unremarkable.

L4-L5: Mild disc space narrowing. Central protrusion. Moderately
advanced facet arthropathy and ligamentum flavum hypertrophy. Mild
central canal stenosis. No definite L5 nerve root impingement.

L5-S1: Severe disc space narrowing. Central disc osteophyte complex.
Mild facet arthropathy. BILATERAL foraminal narrowing due to a
combination of disc narrowing, bony overgrowth, in disc material
could affect either L5 nerve root, but slightly worse on the LEFT.
IMPRESSION: No right-sided compressive lesion is evident. Moderate spondylosis
as described at L4-5 and L5-S1.

Although not asymmetric, there is moderately advanced facet
arthropathy and ligamentum flavum hypertrophy at L4-5. Correlate
clinically for facet mediated lumbago.

## 2017-09-26 DIAGNOSIS — H524 Presbyopia: Secondary | ICD-10-CM | POA: Diagnosis not present

## 2017-09-26 DIAGNOSIS — H25812 Combined forms of age-related cataract, left eye: Secondary | ICD-10-CM | POA: Diagnosis not present

## 2017-09-26 DIAGNOSIS — H5212 Myopia, left eye: Secondary | ICD-10-CM | POA: Diagnosis not present

## 2017-09-26 DIAGNOSIS — H52223 Regular astigmatism, bilateral: Secondary | ICD-10-CM | POA: Diagnosis not present

## 2017-09-26 LAB — HM DIABETES EYE EXAM

## 2018-06-17 DIAGNOSIS — E1165 Type 2 diabetes mellitus with hyperglycemia: Secondary | ICD-10-CM | POA: Insufficient documentation

## 2018-06-18 ENCOUNTER — Telehealth: Payer: Self-pay | Admitting: Family Medicine

## 2018-06-18 ENCOUNTER — Encounter: Payer: Self-pay | Admitting: Family Medicine

## 2018-06-18 ENCOUNTER — Ambulatory Visit (INDEPENDENT_AMBULATORY_CARE_PROVIDER_SITE_OTHER): Payer: Medicare Other | Admitting: Family Medicine

## 2018-06-18 VITALS — BP 168/100 | HR 107 | Temp 98.4°F | Ht 63.1 in | Wt 173.0 lb

## 2018-06-18 DIAGNOSIS — E785 Hyperlipidemia, unspecified: Secondary | ICD-10-CM | POA: Diagnosis not present

## 2018-06-18 DIAGNOSIS — E1169 Type 2 diabetes mellitus with other specified complication: Secondary | ICD-10-CM

## 2018-06-18 DIAGNOSIS — Z9119 Patient's noncompliance with other medical treatment and regimen: Secondary | ICD-10-CM | POA: Diagnosis not present

## 2018-06-18 DIAGNOSIS — Z1239 Encounter for other screening for malignant neoplasm of breast: Secondary | ICD-10-CM

## 2018-06-18 DIAGNOSIS — K047 Periapical abscess without sinus: Secondary | ICD-10-CM

## 2018-06-18 DIAGNOSIS — Z91199 Patient's noncompliance with other medical treatment and regimen due to unspecified reason: Secondary | ICD-10-CM | POA: Insufficient documentation

## 2018-06-18 DIAGNOSIS — I10 Essential (primary) hypertension: Secondary | ICD-10-CM | POA: Diagnosis not present

## 2018-06-18 DIAGNOSIS — E1165 Type 2 diabetes mellitus with hyperglycemia: Secondary | ICD-10-CM

## 2018-06-18 MED ORDER — AMOXICILLIN 875 MG PO TABS: 875.0000 mg | ORAL_TABLET | Freq: Two times a day (BID) | ORAL | 0 refills | Status: DC

## 2018-06-18 MED ORDER — EMPAGLIFLOZIN 25 MG PO TABS: 25.0000 mg | ORAL_TABLET | Freq: Every day | ORAL | 3 refills | Status: DC

## 2018-06-18 NOTE — Telephone Encounter (Signed)
Copied from [place]. Topic: Quick Communication - See Telephone Encounter >> Jun 18, 2018 12:54 PM Sheran Luz wrote: CRM for notification. See Telephone encounter for: 06/18/18.  Pt called to check status of lab results from today 10/30-specifically would like to know what her blood sugar was, stating that it was not given to her on paper at discharge. Please advise.

## 2018-06-18 NOTE — Patient Instructions (Signed)
[place] [phone], Ext. [phone]

## 2018-06-18 NOTE — Telephone Encounter (Signed)
Routing to provider for results when they come back in the morning.

## 2018-06-18 NOTE — Assessment & Plan Note (Signed)
Not under good control. Patient refuses any medication. Willing to try jardiance- we hope this will help lower sugars a bit. Call with any concerns. Recheck 1 month.

## 2018-06-18 NOTE — Progress Notes (Signed)
BP (!) 168/100 (BP Location: Left Arm, Cuff Size: Normal)   Pulse (!) 107   Temp 98.4 F (36.9 C)   Ht 5' 3.1" (1.603 m)   Wt 173 lb (78.5 kg)   SpO2 98%   BMI 30.55 kg/m    Subjective:    Patient ID: Valerie Prince, female    DOB: 1950-08-24, 67 y.o.   MRN: [id number]  HPI: Valerie Prince is a 67 y.o. female who presents today to establish care. Patient denies any medical conditions. She notes that she is religious and does not want to take any medications. She last saw a doctor in the winter last year.  Chief Complaint  Patient presents with  . Establish Care  . Diabetes  . Hypertension  . Hyperlipidemia   DIABETES- has had DM since 2006- she states that that her sugars were from her eating more. She states that she has had worse symptoms with the  Hypoglycemic episodes:no Polydipsia/polyuria: yes Visual disturbance: Yes- saw eye doctor in 2013, has cataract- last saw her eye doctor in April [facility])  Chest pain: no Paresthesias: no Glucose Monitoring: no Taking Insulin?: no Blood Pressure Monitoring: not checking Retinal Examination: Not up to Date Foot Exam: Done today Diabetic Education: Not Completed Pneumovax: Refused Influenza: Refused Aspirin: no  HYPERTENSION / HYPERLIPIDEMIA Satisfied with current treatment? yes Duration of hypertension: chronic BP monitoring frequency: not checking BP medication side effects: Not taking anything Past BP meds: lisinopril, HCTZ Duration of hyperlipidemia: chronic Cholesterol medication side effects: not on anything Cholesterol supplements: fish oil Past cholesterol medications: simvastatin Medication compliance: poor compliance Aspirin: no Recent stressors: no Recurrent headaches: no Visual changes: yes Palpitations: no Dyspnea: no Chest pain: no Lower extremity edema: no Dizzy/lightheaded: no   Relevant past medical, surgical, family and social history reviewed and updated as indicated. Interim  medical history since our last visit reviewed. Allergies and medications reviewed and updated.  Review of Systems  Constitutional: Negative.   HENT: Positive for dental problem. Negative for congestion, drooling, ear discharge, ear pain, facial swelling, hearing loss, mouth sores, nosebleeds, postnasal drip, rhinorrhea, sinus pressure, sinus pain, sneezing, sore throat, tinnitus, trouble swallowing and voice change.   Eyes: Positive for visual disturbance. Negative for photophobia, pain and redness.  Respiratory: Negative.   Cardiovascular: Negative.   Gastrointestinal: Negative.   Neurological: Negative.   Psychiatric/Behavioral: Negative.     Per HPI unless specifically indicated above     Objective:    BP (!) 168/100 (BP Location: Left Arm, Cuff Size: Normal)   Pulse (!) 107   Temp 98.4 F (36.9 C)   Ht 5' 3.1" (1.603 m)   Wt 173 lb (78.5 kg)   SpO2 98%   BMI 30.55 kg/m   Wt Readings from Last 3 Encounters:  06/18/18 173 lb (78.5 kg)  05/17/16 173 lb (78.5 kg)  05/14/16 178 lb (80.7 kg)    Physical Exam  Constitutional: She is oriented to person, place, and time. She appears well-developed and well-nourished. No distress.  HENT:  Head: Normocephalic and atraumatic.  Right Ear: Hearing normal.  Left Ear: Hearing normal.  Nose: Nose normal.  swollen gum over R incisior  Eyes: Conjunctivae and lids are normal. Right eye exhibits no discharge. Left eye exhibits no discharge. No scleral icterus.  Cardiovascular: Normal rate, regular rhythm, normal heart sounds and intact distal pulses. Exam reveals no gallop and no friction rub.  No murmur heard. Pulmonary/Chest: Effort normal and breath sounds normal. No stridor.  No respiratory distress. She has no wheezes. She has no rales. She exhibits no tenderness.  Musculoskeletal: Normal range of motion.  Neurological: She is alert and oriented to person, place, and time.  Skin: Skin is warm, dry and intact. Capillary refill takes  less than 2 seconds. No rash noted. She is not diaphoretic. No erythema. No pallor.  Psychiatric: She has a normal mood and affect. Her speech is normal and behavior is normal. Judgment and thought content normal. Cognition and memory are normal.  Nursing note and vitals reviewed.   No results found for this or any previous visit.    Assessment & Plan:   Problem List Items Addressed This Visit      Cardiovascular and Mediastinum   HTN (hypertension)    Not under good control. Patient refuses any medication. Willing to try jardiance- we hope this will help lower BP a bit. Call with any concerns. Recheck 1 month.       Relevant Orders   CBC with Differential/Platelet   Comprehensive metabolic panel   Microalbumin, Urine Waived   TSH     Endocrine   Uncontrolled type 2 diabetes mellitus with hyperglycemia ([place]) - Primary    Not under good control. Patient refuses any medication. Willing to try jardiance- we hope this will help lower sugars a bit. Call with any concerns. Recheck 1 month.       Relevant Medications   empagliflozin (JARDIANCE) 25 MG TABS tablet   Other Relevant Orders   Bayer DCA Hb A1c Waived   CBC with Differential/Platelet   Comprehensive metabolic panel   Lipid Panel w/o Chol/HDL Ratio   Microalbumin, Urine Waived   TSH   UA/M w/rflx Culture, Routine   Ambulatory referral to Ophthalmology   Hyperlipidemia associated with type 2 diabetes mellitus ([place])    Rechecking levels today. Patient refuses any medication. Call with any concerns. Recheck 1 month.       Relevant Medications   empagliflozin (JARDIANCE) 25 MG TABS tablet   Other Relevant Orders   CBC with Differential/Platelet   Comprehensive metabolic panel   Lipid Panel w/o Chol/HDL Ratio     Other   Non-compliance    Discussion that it is her body and she does not have to take any medication. Discussed that her risks for heart attack and stroke go up hugely with the elevated BP and A1c of 9.5. She  is aware. She states that God will speak to her if she needs to take medicine.        Other Visit Diagnoses    Screening for breast cancer       Mammogram ordered today.   Relevant Orders   MM Digital Diagnostic Bilat   Tooth abscess       WIll treat with amoxicillin. Do not start jardiance until after finishes abx.        Follow up plan: Return in about 4 weeks (around 07/16/2018) for Check tolerance on jardiance.

## 2018-06-18 NOTE — Assessment & Plan Note (Signed)
Rechecking levels today. Patient refuses any medication. Call with any concerns. Recheck 1 month.

## 2018-06-18 NOTE — Assessment & Plan Note (Signed)
Not under good control. Patient refuses any medication. Willing to try jardiance- we hope this will help lower BP a bit. Call with any concerns. Recheck 1 month.

## 2018-06-18 NOTE — Assessment & Plan Note (Signed)
Discussion that it is her body and she does not have to take any medication. Discussed that her risks for heart attack and stroke go up hugely with the elevated BP and A1c of 9.5. She is aware. She states that God will speak to her if she needs to take medicine.

## 2018-06-19 ENCOUNTER — Encounter: Payer: Self-pay | Admitting: Family Medicine

## 2018-06-19 LAB — COMPREHENSIVE METABOLIC PANEL
ALK PHOS: 94 IU/L (ref 39–117)
ALT: 27 IU/L (ref 0–32)
AST: 21 IU/L (ref 0–40)
Albumin/Globulin Ratio: 1.7 (ref 1.2–2.2)
Albumin: 4.5 g/dL (ref 3.6–4.8)
BILIRUBIN TOTAL: 0.8 mg/dL (ref 0.0–1.2)
BUN/Creatinine Ratio: 10 — ABNORMAL LOW (ref 12–28)
BUN: 10 mg/dL (ref 8–27)
CO2: 24 mmol/L (ref 20–29)
CREATININE: 0.97 mg/dL (ref 0.57–1.00)
Calcium: 9.8 mg/dL (ref 8.7–10.3)
Chloride: 101 mmol/L (ref 96–106)
GFR calc Af Amer: 71 mL/min/{1.73_m2} (ref >59)
GFR calc non Af Amer: 61 mL/min/{1.73_m2} (ref >59)
GLUCOSE: 198 mg/dL — AB (ref 65–99)
Globulin, Total: 2.6 g/dL (ref 1.5–4.5)
POTASSIUM: 3.9 mmol/L (ref 3.5–5.2)
SODIUM: 140 mmol/L (ref 134–144)
Total Protein: 7.1 g/dL (ref 6.0–8.5)

## 2018-06-19 LAB — CBC WITH DIFFERENTIAL/PLATELET
BASOS ABS: 0 10*3/uL (ref 0.0–0.2)
Basos: 1 %
EOS (ABSOLUTE): 0.1 10*3/uL (ref 0.0–0.4)
Eos: 2 %
Hematocrit: 41.4 % (ref 34.0–46.6)
Hemoglobin: 13.8 g/dL (ref 11.1–15.9)
Immature Grans (Abs): 0 10*3/uL (ref 0.0–0.1)
Immature Granulocytes: 0 %
LYMPHS ABS: 2.9 10*3/uL (ref 0.7–3.1)
Lymphs: 33 %
MCH: 29.6 pg (ref 26.6–33.0)
MCHC: 33.3 g/dL (ref 31.5–35.7)
MCV: 89 fL (ref 79–97)
MONOCYTES: 6 %
Monocytes Absolute: 0.5 10*3/uL (ref 0.1–0.9)
NEUTROS ABS: 5 10*3/uL (ref 1.4–7.0)
Neutrophils: 58 %
PLATELETS: 304 10*3/uL (ref 150–450)
RBC: 4.67 x10E6/uL (ref 3.77–5.28)
RDW: 13 % (ref 12.3–15.4)
WBC: 8.6 10*3/uL (ref 3.4–10.8)

## 2018-06-19 LAB — TSH: TSH: 1.82 u[IU]/mL (ref 0.450–4.500)

## 2018-06-19 LAB — LIPID PANEL W/O CHOL/HDL RATIO
Cholesterol, Total: 319 mg/dL — ABNORMAL HIGH (ref 100–199)
HDL: 50 mg/dL (ref >39)
LDL CALC: 223 mg/dL — AB (ref 0–99)
Triglycerides: 231 mg/dL — ABNORMAL HIGH (ref 0–149)
VLDL Cholesterol Cal: 46 mg/dL — ABNORMAL HIGH (ref 5–40)

## 2018-06-19 NOTE — Telephone Encounter (Signed)
Left message on machine for pt to return call to the office. CRM updated.  

## 2018-06-19 NOTE — Telephone Encounter (Signed)
Please let her know that her labs came back pretty good. Her cholesterol is very high and her sugar came back at 198. We'll keep an eye on them and recheck next time she comes in to see how she's doing. Thanks. I'm sending a letter to her so she can see her results as well. Thanks.

## 2018-06-20 ENCOUNTER — Telehealth: Payer: Self-pay | Admitting: Family Medicine

## 2018-06-20 NOTE — Telephone Encounter (Signed)
Patient notified

## 2018-06-20 NOTE — Telephone Encounter (Signed)
Copied from [place] [id number]. Topic: Quick Communication - See Telephone Encounter >> Jun 20, 2018  2:01 PM Blase Mess A wrote: CRM for notification. See Telephone encounter for: 06/20/18.  Patient would like to schedule the AWV on 07/16/18 or before.  Please [phone]

## 2018-07-21 ENCOUNTER — Encounter: Payer: Self-pay | Admitting: Family Medicine

## 2018-07-21 ENCOUNTER — Ambulatory Visit (INDEPENDENT_AMBULATORY_CARE_PROVIDER_SITE_OTHER): Payer: Medicare Other | Admitting: Family Medicine

## 2018-07-21 VITALS — BP 148/89 | HR 108 | Temp 97.9°F | Wt 172.7 lb

## 2018-07-21 DIAGNOSIS — E1169 Type 2 diabetes mellitus with other specified complication: Secondary | ICD-10-CM | POA: Diagnosis not present

## 2018-07-21 DIAGNOSIS — E1165 Type 2 diabetes mellitus with hyperglycemia: Secondary | ICD-10-CM | POA: Diagnosis not present

## 2018-07-21 DIAGNOSIS — I10 Essential (primary) hypertension: Secondary | ICD-10-CM

## 2018-07-21 DIAGNOSIS — E785 Hyperlipidemia, unspecified: Secondary | ICD-10-CM | POA: Diagnosis not present

## 2018-07-21 DIAGNOSIS — Z114 Encounter for screening for human immunodeficiency virus [HIV]: Secondary | ICD-10-CM | POA: Diagnosis not present

## 2018-07-21 DIAGNOSIS — Z1159 Encounter for screening for other viral diseases: Secondary | ICD-10-CM

## 2018-07-21 LAB — MICROALBUMIN, URINE WAIVED
Creatinine, Urine Waived: 100 mg/dL (ref 10–300)
Microalb, Ur Waived: 80 mg/L — ABNORMAL HIGH (ref 0–19)

## 2018-07-21 NOTE — Assessment & Plan Note (Signed)
Not under good control. Refuses medicine. Continue to work on [employer]. Call with any concerns.

## 2018-07-21 NOTE — Assessment & Plan Note (Signed)
Patient refuses to accept that she has diabetes. She states that she "does not claim it". Refuses to accept when I tell her that her sugars are high. Would like to check her sugars. Orders placed. Refuses to go see lifestyle center. Eye exam done. Call with any concerns.

## 2018-07-21 NOTE — Progress Notes (Signed)
BP (!) 148/89 (BP Location: Right Arm, Cuff Size: Large)   Pulse (!) 108   Temp 97.9 F (36.6 C) (Oral)   Wt 172 lb 11.2 oz (78.3 kg)   SpO2 97%   BMI 30.50 kg/m    Subjective:    Patient ID: Valerie Prince, female    DOB: [birth year], 67 y.o.   MRN: [id number]  HPI: Valerie Prince is a 67 y.o. female  Chief Complaint  Patient presents with  . Medication Follow up    patient states she did not start the jardiance because of the price    DIABETES- Patient did not pick up her jardiance.   Hypoglycemic episodes:no Polydipsia/polyuria: no Visual disturbance: no Chest pain: no Paresthesias: no Glucose Monitoring: no  Accucheck frequency: Not Checking Taking Insulin?: no Blood Pressure Monitoring: not checking Retinal Examination: Up to Date Foot Exam: Up to Date Diabetic Education: Not Completed Pneumovax: Refused Influenza: Refused Aspirin: no  HYPERTENSION / HYPERLIPIDEMIA Satisfied with current treatment? yes Duration of hypertension: chronic BP monitoring frequency: not checking BP medication side effects: not on anything Past BP meds: none Duration of hyperlipidemia: chronic Cholesterol medication side effects: not on anything Cholesterol supplements: fish oil Past cholesterol medications: none Medication compliance: poor compliance Aspirin: no Recent stressors: no Recurrent headaches: no Visual changes: no Palpitations: no Dyspnea: no Chest pain: no Lower extremity edema: no Dizzy/lightheaded: no   Relevant past medical, surgical, family and social history reviewed and updated as indicated. Interim medical history since our last visit reviewed. Allergies and medications reviewed and updated.  Review of Systems  Constitutional: Negative.   Respiratory: Negative.   Cardiovascular: Negative.   Psychiatric/Behavioral: Negative.     Per HPI unless specifically indicated above     Objective:    BP (!) 148/89 (BP Location: Right Arm, Cuff Size:  Large)   Pulse (!) 108   Temp 97.9 F (36.6 C) (Oral)   Wt 172 lb 11.2 oz (78.3 kg)   SpO2 97%   BMI 30.50 kg/m   Wt Readings from Last 3 Encounters:  07/21/18 172 lb 11.2 oz (78.3 kg)  06/18/18 173 lb (78.5 kg)  05/17/16 173 lb (78.5 kg)    Physical Exam  Constitutional: She is oriented to person, place, and time. She appears well-developed and well-nourished. No distress.  HENT:  Head: Normocephalic and atraumatic.  Right Ear: Hearing normal.  Left Ear: Hearing normal.  Nose: Nose normal.  Eyes: Conjunctivae and lids are normal. Right eye exhibits no discharge. Left eye exhibits no discharge. No scleral icterus.  Cardiovascular: Normal rate, regular rhythm, normal heart sounds and intact distal pulses. Exam reveals no gallop and no friction rub.  No murmur heard. Pulmonary/Chest: Effort normal and breath sounds normal. No stridor. No respiratory distress. She has no wheezes. She has no rales. She exhibits no tenderness.  Musculoskeletal: Normal range of motion.  Neurological: She is alert and oriented to person, place, and time.  Skin: Skin is warm, dry and intact. Capillary refill takes less than 2 seconds. No rash noted. She is not diaphoretic. No erythema. No pallor.  Psychiatric: She has a normal mood and affect. Her speech is normal and behavior is normal. Judgment and thought content normal. Cognition and memory are normal.  Nursing note and vitals reviewed.   Results for orders placed or performed in visit on 06/18/18  CBC with Differential/Platelet  Result Value Ref Range   WBC 8.6 3.4 - 10.8 x10E3/uL   RBC 4.67 3.77 -  5.28 x10E6/uL   Hemoglobin 13.8 11.1 - 15.9 g/dL   Hematocrit 41.4 34.0 - 46.6 %   MCV 89 79 - 97 fL   MCH 29.6 26.6 - 33.0 pg   MCHC 33.3 31.5 - 35.7 g/dL   RDW 13.0 12.3 - 15.4 %   Platelets 304 150 - 450 x10E3/uL   Neutrophils 58 Not Estab. %   Lymphs 33 Not Estab. %   Monocytes 6 Not Estab. %   Eos 2 Not Estab. %   Basos 1 Not Estab. %    Neutrophils Absolute 5.0 1.4 - 7.0 x10E3/uL   Lymphocytes Absolute 2.9 0.7 - 3.1 x10E3/uL   Monocytes Absolute 0.5 0.1 - 0.9 x10E3/uL   EOS (ABSOLUTE) 0.1 0.0 - 0.4 x10E3/uL   Basophils Absolute 0.0 0.0 - 0.2 x10E3/uL   Immature Granulocytes 0 Not Estab. %   Immature Grans (Abs) 0.0 0.0 - 0.1 x10E3/uL  Comprehensive metabolic panel  Result Value Ref Range   Glucose 198 (H) 65 - 99 mg/dL   BUN 10 8 - 27 mg/dL   Creatinine, Ser 0.97 0.57 - 1.00 mg/dL   GFR calc non Af Amer 61 >59 mL/min/1.73   GFR calc Af Amer 71 >59 mL/min/1.73   BUN/Creatinine Ratio 10 (L) 12 - 28   Sodium 140 134 - 144 mmol/L   Potassium 3.9 3.5 - 5.2 mmol/L   Chloride 101 96 - 106 mmol/L   CO2 24 20 - 29 mmol/L   Calcium 9.8 8.7 - 10.3 mg/dL   Total Protein 7.1 6.0 - 8.5 g/dL   Albumin 4.5 3.6 - 4.8 g/dL   Globulin, Total 2.6 1.5 - 4.5 g/dL   Albumin/Globulin Ratio 1.7 1.2 - 2.2   Bilirubin Total 0.8 0.0 - 1.2 mg/dL   Alkaline Phosphatase 94 39 - 117 IU/L   AST 21 0 - 40 IU/L   ALT 27 0 - 32 IU/L  Lipid Panel w/o Chol/HDL Ratio  Result Value Ref Range   Cholesterol, Total 319 (H) 100 - 199 mg/dL   Triglycerides 231 (H) 0 - 149 mg/dL   HDL 50 >39 mg/dL   VLDL Cholesterol Cal 46 (H) 5 - 40 mg/dL   LDL Calculated 223 (H) 0 - 99 mg/dL  TSH  Result Value Ref Range   TSH 1.820 0.450 - 4.500 uIU/mL      Assessment & Plan:   Problem List Items Addressed This Visit      Cardiovascular and Mediastinum   HTN (hypertension)    Not under good control. Refuses medicine. Continue to work on [employer]. Call with any concerns.         Endocrine   Uncontrolled type 2 diabetes mellitus with hyperglycemia ([place]) - Primary    Patient refuses to accept that she has diabetes. She states that she "does not claim it". Refuses to accept when I tell her that her sugars are high. Would like to check her sugars. Orders placed. Refuses to go see lifestyle center. Eye exam done. Call with any concerns.       Relevant Orders     Comprehensive metabolic panel   Microalbumin, Urine Waived   Hyperlipidemia associated with type 2 diabetes mellitus ([place])    Not under good control. Refuses medicine. Continue to work on [employer]. Call with any concerns.       Relevant Orders   Comprehensive metabolic panel   Lipid Panel w/o Chol/HDL Ratio    Other Visit Diagnoses    Screening for  HIV (human immunodeficiency virus)       Labs drawn today. Await results.    Relevant Orders   HIV Antibody (routine testing w rflx)   Need for hepatitis C screening test       Labs drawn today. Await results.    Relevant Orders   Hepatitis C Antibody       Follow up plan: Return in about 2 months (around 09/21/2018) for follow up DM.

## 2018-07-21 NOTE — Patient Instructions (Signed)
DASH Eating Plan DASH stands for "Dietary Approaches to Stop Hypertension." The DASH eating plan is a healthy eating plan that has been shown to reduce high blood pressure (hypertension). It may also reduce your risk for type 2 diabetes, heart disease, and stroke. The DASH eating plan may also help with weight loss. What are tips for following this plan? General guidelines  Avoid eating more than 2,300 mg (milligrams) of salt (sodium) a day. If you have hypertension, you may need to reduce your sodium intake to 1,500 mg a day.  Limit alcohol intake to no more than 1 drink a day for nonpregnant women and 2 drinks a day for men. One drink equals 12 oz of beer, 5 oz of wine, or 1 oz of hard liquor.  Work with your health care provider to maintain a healthy body weight or to lose weight. Ask what an ideal weight is for you.  Get at least 30 minutes of exercise that causes your heart to beat faster (aerobic exercise) most days of the week. Activities may include walking, swimming, or biking.  Work with your health care provider or diet and nutrition specialist (dietitian) to adjust your eating plan to your individual calorie needs. Reading food labels  Check food labels for the amount of sodium per serving. Choose foods with less than 5 percent of the Daily Value of sodium. Generally, foods with less than 300 mg of sodium per serving fit into this eating plan.  To find whole grains, look for the word "whole" as the first word in the ingredient list. Shopping  Buy products labeled as "low-sodium" or "no salt added."  Buy fresh foods. Avoid canned foods and premade or frozen meals. Cooking  Avoid adding salt when cooking. Use salt-free seasonings or herbs instead of table salt or sea salt. Check with your health care provider or pharmacist before using salt substitutes.  Do not fry foods. Cook foods using healthy methods such as baking, boiling, grilling, and broiling instead.  Cook with  heart-healthy oils, such as olive, canola, soybean, or sunflower oil. Meal planning   Eat a balanced diet that includes: ? 5 or more servings of fruits and vegetables each day. At each meal, try to fill half of your plate with fruits and vegetables. ? Up to 6-8 servings of whole grains each day. ? Less than 6 oz of lean meat, poultry, or fish each day. A 3-oz serving of meat is about the same size as a deck of cards. One egg equals 1 oz. ? 2 servings of low-fat dairy each day. ? A serving of nuts, seeds, or beans 5 times each week. ? Heart-healthy fats. Healthy fats called Omega-3 fatty acids are found in foods such as flaxseeds and coldwater fish, like sardines, salmon, and mackerel.  Limit how much you eat of the following: ? Canned or prepackaged foods. ? Food that is high in trans fat, such as fried foods. ? Food that is high in saturated fat, such as fatty meat. ? Sweets, desserts, sugary drinks, and other foods with added sugar. ? Full-fat dairy products.  Do not salt foods before eating.  Try to eat at least 2 vegetarian meals each week.  Eat more home-cooked food and less restaurant, buffet, and fast food.  When eating at a restaurant, ask that your food be prepared with less salt or no salt, if possible. What foods are recommended? The items listed may not be a complete list. Talk with your dietitian about what   dietary choices are best for you. Grains Whole-grain or whole-wheat bread. Whole-grain or whole-wheat pasta. Brown rice. [place]. Bulgur. Whole-grain and low-sodium cereals. Pita bread. Low-fat, low-sodium crackers. Whole-wheat flour tortillas. Vegetables Fresh or frozen vegetables (raw, steamed, roasted, or grilled). Low-sodium or reduced-sodium tomato and vegetable juice. Low-sodium or reduced-sodium tomato sauce and tomato paste. Low-sodium or reduced-sodium canned vegetables. Fruits All fresh, dried, or frozen fruit. Canned fruit in natural juice (without  added sugar). Meat and other protein foods Skinless chicken or [place]. Ground chicken or [place]. Pork with fat trimmed off. Fish and seafood. Egg whites. Dried beans, peas, or lentils. Unsalted nuts, nut butters, and seeds. Unsalted canned beans. Lean cuts of beef with fat trimmed off. Low-sodium, lean deli meat. Dairy Low-fat (1%) or fat-free (skim) milk. Fat-free, low-fat, or reduced-fat cheeses. Nonfat, low-sodium ricotta or cottage cheese. Low-fat or nonfat yogurt. Low-fat, low-sodium cheese. Fats and oils Soft margarine without trans fats. Vegetable oil. Low-fat, reduced-fat, or light mayonnaise and salad dressings (reduced-sodium). Canola, safflower, olive, soybean, and sunflower oils. Avocado. Seasoning and other foods Herbs. Spices. Seasoning mixes without salt. Unsalted popcorn and pretzels. Fat-free sweets. What foods are not recommended? The items listed may not be a complete list. Talk with your dietitian about what dietary choices are best for you. Grains Baked goods made with fat, such as croissants, muffins, or some breads. Dry pasta or rice meal packs. Vegetables Creamed or fried vegetables. Vegetables in a cheese sauce. Regular canned vegetables (not low-sodium or reduced-sodium). Regular canned tomato sauce and paste (not low-sodium or reduced-sodium). Regular tomato and vegetable juice (not low-sodium or reduced-sodium). Angie Fava. Olives. Fruits Canned fruit in a light or heavy syrup. Fried fruit. Fruit in cream or butter sauce. Meat and other protein foods Fatty cuts of meat. Ribs. Fried meat. Berniece Salines. Sausage. Bologna and other processed lunch meats. Salami. Fatback. Hotdogs. Bratwurst. Salted nuts and seeds. Canned beans with added salt. Canned or smoked fish. Whole eggs or egg yolks. Chicken or [place] with skin. Dairy Whole or 2% milk, cream, and half-and-half. Whole or full-fat cream cheese. Whole-fat or sweetened yogurt. Full-fat cheese. Nondairy creamers. Whipped toppings.  Processed cheese and cheese spreads. Fats and oils Butter. Stick margarine. Lard. Shortening. Ghee. Bacon fat. Tropical oils, such as coconut, palm kernel, or palm oil. Seasoning and other foods Salted popcorn and pretzels. Onion salt, garlic salt, seasoned salt, table salt, and sea salt. Worcestershire sauce. Tartar sauce. Barbecue sauce. Teriyaki sauce. Soy sauce, including reduced-sodium. Steak sauce. Canned and packaged gravies. Fish sauce. Oyster sauce. Cocktail sauce. Horseradish that you find on the shelf. Ketchup. Mustard. Meat flavorings and tenderizers. Bouillon cubes. Hot sauce and Tabasco sauce. Premade or packaged marinades. Premade or packaged taco seasonings. Relishes. Regular salad dressings. Where to find more information:  National Heart, Lung, and [place]: https://wilson-eaton.com/  American Heart Association: www.heart.org Summary  The DASH eating plan is a healthy eating plan that has been shown to reduce high blood pressure (hypertension). It may also reduce your risk for type 2 diabetes, heart disease, and stroke.  With the DASH eating plan, you should limit salt (sodium) intake to 2,300 mg a day. If you have hypertension, you may need to reduce your sodium intake to 1,500 mg a day.  When on the DASH eating plan, aim to eat more fresh fruits and vegetables, whole grains, lean proteins, low-fat dairy, and heart-healthy fats.  Work with your health care provider or diet and nutrition specialist (dietitian) to adjust your eating plan to your individual  calorie needs. This information is not intended to replace advice given to you by your health care provider. Make sure you discuss any questions you have with your health care provider. Document Released: 07/26/2011 Document Revised: 07/30/2016 Document Reviewed: 07/30/2016 Elsevier Interactive Patient Education  2018 [place].  Blood Glucose Monitoring, Adult Monitoring your blood sugar (glucose) helps you manage your  diabetes. It also helps you and your health care provider determine how well your diabetes management plan is working. Blood glucose monitoring involves checking your blood glucose as often as directed, and keeping a record (log) of your results over time. Why should I monitor my blood glucose? Checking your blood glucose regularly can:  Help you understand how food, exercise, illnesses, and medicines affect your blood glucose.  Let you know what your blood glucose is at any time. You can quickly tell if you are having low blood glucose (hypoglycemia) or high blood glucose (hyperglycemia).  Help you and your health care provider adjust your medicines as needed.  When should I check my blood glucose? Follow instructions from your health care provider about how often to check your blood glucose. This may depend on:  The type of diabetes you have.  How well-controlled your diabetes is.  Medicines you are taking.  If you have type 1 diabetes:  Check your blood glucose at least 2 times a day.  Also check your blood glucose: ? Before every insulin injection. ? Before and after exercise. ? Between meals. ? 2 hours after a meal. ? Occasionally between 2:00 a.m. and 3:00 a.m., as directed. ? Before potentially dangerous tasks, like driving or using heavy machinery. ? At bedtime.  You may need to check your blood glucose more often, up to 6-10 times a day: ? If you use an insulin pump. ? If you need multiple daily injections (MDI). ? If your diabetes is not well-controlled. ? If you are ill. ? If you have a history of severe hypoglycemia. ? If you have a history of not knowing when your blood glucose is getting low (hypoglycemia unawareness). If you have type 2 diabetes:  If you take insulin or other diabetes medicines, check your blood glucose at least 2 times a day.  If you are on intensive insulin therapy, check your blood glucose at least 4 times a day. Occasionally, you may also  need to check between 2:00 a.m. and 3:00 a.m., as directed.  Also check your blood glucose: ? Before and after exercise. ? Before potentially dangerous tasks, like driving or using heavy machinery.  You may need to check your blood glucose more often if: ? Your medicine is being adjusted. ? Your diabetes is not well-controlled. ? You are ill. What is a blood glucose log?  A blood glucose log is a record of your blood glucose readings. It helps you and your health care provider: ? Look for patterns in your blood glucose over time. ? Adjust your diabetes management plan as needed.  Every time you check your blood glucose, write down your result and notes about things that may be affecting your blood glucose, such as your diet and exercise for the day.  Most glucose meters store a record of glucose readings in the meter. Some meters allow you to download your records to a computer. How do I check my blood glucose? Follow these steps to get accurate readings of your blood glucose: Supplies needed   Blood glucose meter.  Test strips for your meter. Each meter has  its own strips. You must use the strips that come with your meter.  A needle to prick your finger (lancet). Do not use lancets more than once.  A device that holds the lancet (lancing device).  A journal or log book to write down your results. Procedure  Wash your hands with soap and water.  Prick the side of your finger (not the tip) with the lancet. Use a different finger each time.  Gently rub the finger until a small drop of blood appears.  Follow instructions that come with your meter for inserting the test strip, applying blood to the strip, and using your blood glucose meter.  Write down your result and any notes. Alternative testing sites  Some meters allow you to use areas of your body other than your finger (alternative sites) to test your blood.  If you think you may have hypoglycemia, or if you have  hypoglycemia unawareness, do not use alternative sites. Use your finger instead.  Alternative sites may not be as accurate as the fingers, because blood flow is slower in these areas. This means that the result you get may be delayed, and it may be different from the result that you would get from your finger.  The most common alternative sites are: ? Forearm. ? Thigh. ? Palm of the hand. Additional tips  Always keep your supplies with you.  If you have questions or need help, all blood glucose meters have a 24-hour "hotline" number that you can call. You may also contact your health care provider.  After you use a few boxes of test strips, adjust (calibrate) your blood glucose meter by following instructions that came with your meter. This information is not intended to replace advice given to you by your health care provider. Make sure you discuss any questions you have with your health care provider. Document Released: 08/09/2003 Document Revised: 02/24/2016 Document Reviewed: 01/16/2016 Elsevier Interactive Patient Education  2017 [place].  Complementary and Alternative Medical Therapies for Diabetes Complementary and alternative medical therapies are treatments that are different from typical medical treatments (Western treatments). "Complementary" means that the therapy is used with Western treatments. "Alternative" means that the therapy is used instead of Western treatments. Are these therapies safe? Some of these therapies are usually safe. Others may be harmful. Often, there is not enough research to show how safe or effective a therapy is. If you want to try a complementary or alternative therapy, talk with your health care provider to make sure it is safe. What alternative or complementary therapies are used to treat diabetes? Acupuncture Acupuncture is the insertion of needles into certain places on the skin. This is done by a professional. It is often used to relieve  long-term (chronic) pain, especially of the bones and joints. It may help you if you have painful nerve damage. Biofeedback Biofeedback helps you to become more aware of your body's response to pain. It also helps you to learn ways of dealing with pain. Biofeedback is about relaxing and reducing stress. An example of a biofeedback technique is guided imagery. This involves creating peaceful images in your mind. Chromium Chromium is a substance that can help improve how insulin works in the body. Chromium is in many foods, including whole grains, nuts, and egg yolks. Chromium may also be taken as a supplement. Taking chromium supplements may help to control diabetes, especially if you have a lack of chromium (deficiency) in your body. However, research has not proven this. If you  have kidney problems, you should be careful with chromium supplements. American ginseng American ginseng is an herb that may lower glucose levels. It may also help lower A1C levels. More research is needed before recommendations for ginseng use can be made. Magnesium Magnesium is a mineral found in many foods, such as whole grains, nuts, and green leafy vegetables. Having low magnesium levels may make controlling blood glucose more difficult for people who have type 2 diabetes. Low magnesium levels may also contribute to certain diabetes complications. Getting more magnesium and eating a high-fiber diet may reduce the risk of developing type 2 diabetes. Vanadium Vanadium is a compound found in small amounts in certain plants and animals. Some studies show that it improves glucose levels in animals with diabetes. In one study, people with diabetes were able to lower their insulin dosage when taking vanadium. More research about side effects and safe dosage levels is needed. Cinnamon Cinnamon may decrease insulin resistance, increase insulin production, and lower blood glucose levels. It may work best when used with diabetes  medicines. Fenugreek Fenugreek is an herb whose seeds are often used in cooking. It may help lower blood glucose by decreasing carbohydrate absorption and increasing insulin production. Summary  Talk with your health care provider about complementary or alternative therapy for you. Some therapies may be appropriate for you, but others may cause side effects.  Follow your diabetes care plan as prescribed. This information is not intended to replace advice given to you by your health care provider. Make sure you discuss any questions you have with your health care provider. Document Released: 06/03/2007 Document Revised: 08/22/2016 Document Reviewed: 08/22/2016 Elsevier Interactive Patient Education  2017 [place].  Diabetes Mellitus and Standards of Medical Care Managing diabetes (diabetes mellitus) can be complicated. Your diabetes treatment may be managed by a team of health care providers, including:  A diet and nutrition specialist (registered dietitian).  A nurse.  A certified diabetes educator (CDE).  A diabetes specialist (endocrinologist).  An eye doctor.  A primary care provider.  A dentist.  Your health care providers follow a schedule in order to help you get the best quality of care. The following schedule is a general guideline for your diabetes management plan. Your health care providers may also give you more specific instructions. HbA1c ( hemoglobin A1c) test This test provides information about blood sugar (glucose) control over the previous 2-3 months. It is used to check whether your diabetes management plan needs to be adjusted.  If you are meeting your treatment goals, this test is done at least 2 times a year.  If you are not meeting treatment goals or if your treatment goals have changed, this test is done 4 times a year.  Blood pressure test  This test is done at every routine medical visit. For most people, the goal is less than 130/80. Ask your  health care provider what your goal blood pressure should be. Dental and eye exams  Visit your dentist two times a year.  If you have type 1 diabetes, get an eye exam 3-5 years after you are diagnosed, and then once a year after your first exam. ? If you were diagnosed with type 1 diabetes as a child, get an eye exam when you are age 47 or older and have had diabetes for 3-5 years. After the first exam, you should get an eye exam once a year.  If you have type 2 diabetes, have an eye exam as soon as you  are diagnosed, and then once a year after your first exam. Foot care exam  Visual foot exams are done at every routine medical visit. The exams check for cuts, bruises, redness, blisters, sores, or other problems with the feet.  A complete foot exam is done by your health care provider once a year. This exam includes an inspection of the structure and skin of your feet, and a check of the pulses and sensation in your feet. ? Type 1 diabetes: Get your first exam 3-5 years after diagnosis. ? Type 2 diabetes: Get your first exam as soon as you are diagnosed.  Check your feet every day for cuts, bruises, redness, blisters, or sores. If you have any of these or other problems that are not healing, contact your health care provider. Kidney function test ( urine microalbumin)  This test is done once a year. ? Type 1 diabetes: Get your first test 5 years after diagnosis. ? Type 2 diabetes: Get your first test as soon as you are diagnosed.  If you have chronic kidney disease (CKD), get a serum creatinine and estimated glomerular filtration rate (eGFR) test once a year. Lipid profile (cholesterol, HDL, LDL, triglycerides)  This test should be done when you are diagnosed with diabetes, and every 5 years after the first test. If you are on medicines to lower your cholesterol, you may need to get this test done every year. ? The goal for LDL is less than 100 mg/dL (5.5 mmol/L). If you are at high  risk, the goal is less than 70 mg/dL (3.9 mmol/L). ? The goal for HDL is 40 mg/dL (2.2 mmol/L) for men and 50 mg/dL(2.8 mmol/L) for women. An HDL cholesterol of 60 mg/dL (3.3 mmol/L) or higher gives some protection against heart disease. ? The goal for triglycerides is less than 150 mg/dL (8.3 mmol/L). Immunizations  The yearly flu (influenza) vaccine is recommended for everyone 6 months or older who has diabetes.  The pneumonia (pneumococcal) vaccine is recommended for everyone 2 years or older who has diabetes. If you are 78 or older, you may get the pneumonia vaccine as a series of two separate shots.  The hepatitis B vaccine is recommended for adults shortly after they have been diagnosed with diabetes.  The Tdap (tetanus, diphtheria, and pertussis) vaccine should be given: ? According to normal childhood vaccination schedules, for children. ? Every 10 years, for adults who have diabetes.  The shingles vaccine is recommended for people who have had chicken pox and are 50 years or older. Mental and emotional health  Screening for symptoms of eating disorders, anxiety, and depression is recommended at the time of diagnosis and afterward as needed. If your screening shows that you have symptoms (you have a positive screening result), you may need further evaluation and be referred to a mental health care provider. Diabetes self-management education  Education about how to manage your diabetes is recommended at diagnosis and ongoing as needed. Treatment plan  Your treatment plan will be reviewed at every medical visit. Summary  Managing diabetes (diabetes mellitus) can be complicated. Your diabetes treatment may be managed by a team of health care providers.  Your health care providers follow a schedule in order to help you get the best quality of care.  Standards of care including having regular physical exams, blood tests, blood pressure monitoring, immunizations, screening tests,  and education about how to manage your diabetes.  Your health care providers may also give you more specific instructions based  on your individual health. This information is not intended to replace advice given to you by your health care provider. Make sure you discuss any questions you have with your health care provider. Document Released: 06/03/2009 Document Revised: 05/04/2016 Document Reviewed: 05/04/2016 Elsevier Interactive Patient Education  [employer].

## 2018-07-22 ENCOUNTER — Encounter: Payer: Self-pay | Admitting: Family Medicine

## 2018-07-22 LAB — LIPID PANEL W/O CHOL/HDL RATIO
Cholesterol, Total: 324 mg/dL — ABNORMAL HIGH (ref 100–199)
HDL: 53 mg/dL
LDL Calculated: 221 mg/dL — ABNORMAL HIGH (ref 0–99)
Triglycerides: 249 mg/dL — ABNORMAL HIGH (ref 0–149)
VLDL Cholesterol Cal: 50 mg/dL — ABNORMAL HIGH (ref 5–40)

## 2018-07-22 LAB — COMPREHENSIVE METABOLIC PANEL
ALBUMIN: 4.6 g/dL (ref 3.6–4.8)
ALK PHOS: 97 IU/L (ref 39–117)
ALT: 28 IU/L (ref 0–32)
AST: 25 IU/L (ref 0–40)
Albumin/Globulin Ratio: 1.8 (ref 1.2–2.2)
BILIRUBIN TOTAL: 0.6 mg/dL (ref 0.0–1.2)
BUN / CREAT RATIO: 14 (ref 12–28)
BUN: 15 mg/dL (ref 8–27)
CHLORIDE: 92 mmol/L — AB (ref 96–106)
CO2: 23 mmol/L (ref 20–29)
Calcium: 10.1 mg/dL (ref 8.7–10.3)
Creatinine, Ser: 1.07 mg/dL — ABNORMAL HIGH (ref 0.57–1.00)
GFR calc non Af Amer: 55 mL/min/{1.73_m2} — ABNORMAL LOW (ref >59)
GFR, EST AFRICAN AMERICAN: 63 mL/min/{1.73_m2} (ref >59)
GLOBULIN, TOTAL: 2.6 g/dL (ref 1.5–4.5)
Glucose: 253 mg/dL — ABNORMAL HIGH (ref 65–99)
Potassium: 4.2 mmol/L (ref 3.5–5.2)
SODIUM: 135 mmol/L (ref 134–144)
Total Protein: 7.2 g/dL (ref 6.0–8.5)

## 2018-07-22 LAB — HIV ANTIBODY (ROUTINE TESTING W REFLEX): HIV Screen 4th Generation wRfx: NONREACTIVE

## 2018-07-22 LAB — HEPATITIS C ANTIBODY: Hep C Virus Ab: <0.1 {s_co_ratio} (ref 0.0–0.9)

## 2018-09-25 ENCOUNTER — Encounter: Payer: Self-pay | Admitting: Family Medicine

## 2018-09-25 ENCOUNTER — Ambulatory Visit (INDEPENDENT_AMBULATORY_CARE_PROVIDER_SITE_OTHER): Payer: Medicare Other | Admitting: Family Medicine

## 2018-09-25 VITALS — BP 148/88 | HR 89 | Temp 98.2°F | Ht 63.1 in | Wt 178.3 lb

## 2018-09-25 DIAGNOSIS — E1165 Type 2 diabetes mellitus with hyperglycemia: Secondary | ICD-10-CM | POA: Diagnosis not present

## 2018-09-25 LAB — BAYER DCA HB A1C WAIVED: HB A1C: 10.1 % — AB (ref <7.0)

## 2018-09-25 MED ORDER — EMPAGLIFLOZIN 25 MG PO TABS: 25.0000 mg | ORAL_TABLET | Freq: Every day | ORAL | 3 refills | Status: DC

## 2018-09-25 NOTE — Progress Notes (Signed)
BP (!) 148/88 (BP Location: Left Arm, Patient Position: Sitting, Cuff Size: Large)   Pulse 89   Temp 98.2 F (36.8 C)   Ht 5' 3.1" (1.603 m)   Wt 178 lb 5 oz (80.9 kg)   SpO2 99%   BMI 31.49 kg/m    Subjective:    Patient ID: Valerie Prince, female    DOB: 1950-12-28, 68 y.o.   MRN: [id number]  HPI: Valerie Prince is a 68 y.o. female  Chief Complaint  Patient presents with  . Diabetes   DIABETES Hypoglycemic episodes:no Polydipsia/polyuria: no Visual disturbance: no Chest pain: no Paresthesias: no Glucose Monitoring: no  Accucheck frequency: Not Checking Taking Insulin?: no Blood Pressure Monitoring: not checking Retinal Examination: Refused Foot Exam: Up to Date Diabetic Education: Not Completed Pneumovax: Refused Influenza: Refused Aspirin: No   Relevant past medical, surgical, family and social history reviewed and updated as indicated. Interim medical history since our last visit reviewed. Allergies and medications reviewed and updated.  Review of Systems  Constitutional: Negative.   Respiratory: Negative.   Cardiovascular: Negative.   Neurological: Negative.   Psychiatric/Behavioral: Negative.     Per HPI unless specifically indicated above     Objective:    BP (!) 148/88 (BP Location: Left Arm, Patient Position: Sitting, Cuff Size: Large)   Pulse 89   Temp 98.2 F (36.8 C)   Ht 5' 3.1" (1.603 m)   Wt 178 lb 5 oz (80.9 kg)   SpO2 99%   BMI 31.49 kg/m   Wt Readings from Last 3 Encounters:  09/25/18 178 lb 5 oz (80.9 kg)  07/21/18 172 lb 11.2 oz (78.3 kg)  06/18/18 173 lb (78.5 kg)    Physical Exam Vitals signs and nursing note reviewed.  Constitutional:      General: She is not in acute distress.    Appearance: Normal appearance. She is not ill-appearing, toxic-appearing or diaphoretic.  HENT:     Head: Normocephalic and atraumatic.     Right Ear: External ear normal.     Left Ear: External ear normal.     Nose: Nose normal.   Mouth/Throat:     Mouth: Mucous membranes are moist.     Pharynx: Oropharynx is clear.  Eyes:     General: No scleral icterus.       Right eye: No discharge.        Left eye: No discharge.     Extraocular Movements: Extraocular movements intact.     Conjunctiva/sclera: Conjunctivae normal.     Pupils: Pupils are equal, round, and reactive to light.  Neck:     Musculoskeletal: Normal range of motion and neck supple.  Cardiovascular:     Rate and Rhythm: Normal rate and regular rhythm.     Pulses: Normal pulses.     Heart sounds: Normal heart sounds. No murmur. No friction rub. No gallop.   Pulmonary:     Effort: Pulmonary effort is normal. No respiratory distress.     Breath sounds: Normal breath sounds. No stridor. No wheezing, rhonchi or rales.  Chest:     Chest wall: No tenderness.  Musculoskeletal: Normal range of motion.  Skin:    General: Skin is warm and dry.     Capillary Refill: Capillary refill takes less than 2 seconds.     Coloration: Skin is not jaundiced or pale.     Findings: No bruising, erythema, lesion or rash.  Neurological:     General: No focal deficit present.  Mental Status: She is alert and oriented to person, place, and time. Mental status is at baseline.  Psychiatric:        Mood and Affect: Mood normal.        Behavior: Behavior normal.        Thought Content: Thought content normal.        Judgment: Judgment normal.     Results for orders placed or performed in visit on 07/21/18  Comprehensive metabolic panel  Result Value Ref Range   Glucose 253 (H) 65 - 99 mg/dL   BUN 15 8 - 27 mg/dL   Creatinine, Ser 1.07 (H) 0.57 - 1.00 mg/dL   GFR calc non Af Amer 55 (L) >59 mL/min/1.73   GFR calc Af Amer 63 >59 mL/min/1.73   BUN/Creatinine Ratio 14 12 - 28   Sodium 135 134 - 144 mmol/L   Potassium 4.2 3.5 - 5.2 mmol/L   Chloride 92 (L) 96 - 106 mmol/L   CO2 23 20 - 29 mmol/L   Calcium 10.1 8.7 - 10.3 mg/dL   Total Protein 7.2 6.0 - 8.5 g/dL    Albumin 4.6 3.6 - 4.8 g/dL   Globulin, Total 2.6 1.5 - 4.5 g/dL   Albumin/Globulin Ratio 1.8 1.2 - 2.2   Bilirubin Total 0.6 0.0 - 1.2 mg/dL   Alkaline Phosphatase 97 39 - 117 IU/L   AST 25 0 - 40 IU/L   ALT 28 0 - 32 IU/L  Lipid Panel w/o Chol/HDL Ratio  Result Value Ref Range   Cholesterol, Total 324 (H) 100 - 199 mg/dL   Triglycerides 249 (H) 0 - 149 mg/dL   HDL 53 >39 mg/dL   VLDL Cholesterol Cal 50 (H) 5 - 40 mg/dL   LDL Calculated 221 (H) 0 - 99 mg/dL   Comment: Comment   Microalbumin, Urine Waived  Result Value Ref Range   Microalb, Ur Waived 80 (H) 0 - 19 mg/L   Creatinine, Urine Waived 100 10 - 300 mg/dL   Microalb/Creat Ratio 30-300 (H) <30 mg/g  HIV Antibody (routine testing w rflx)  Result Value Ref Range   HIV Screen 4th Generation wRfx Non Reactive Non Reactive  Hepatitis C Antibody  Result Value Ref Range   Hep C Virus Ab <0.1 0.0 - 0.9 s/co ratio      Assessment & Plan:   Problem List Items Addressed This Visit      Endocrine   Uncontrolled type 2 diabetes mellitus with hyperglycemia ([place]) - Primary    Going in the wrong direction with A1c up to 10.1. She states she will take jardiance. Rx sent to her pharmacy. Recheck 3 months. Call with any concerns.       Relevant Medications   empagliflozin (JARDIANCE) 25 MG TABS tablet   Other Relevant Orders   Bayer DCA Hb A1c Waived       Follow up plan: Return in about 3 months (around 12/24/2018) for 6 month visit.

## 2018-09-25 NOTE — Assessment & Plan Note (Signed)
Going in the wrong direction with A1c up to 10.1. She states she will take jardiance. Rx sent to her pharmacy. Recheck 3 months. Call with any concerns.

## 2018-10-24 ENCOUNTER — Other Ambulatory Visit: Payer: Self-pay | Admitting: Family Medicine

## 2018-10-24 DIAGNOSIS — Z1231 Encounter for screening mammogram for malignant neoplasm of breast: Secondary | ICD-10-CM

## 2018-10-30 ENCOUNTER — Encounter: Payer: Self-pay | Admitting: Family Medicine

## 2018-10-30 ENCOUNTER — Ambulatory Visit
Admission: RE | Admit: 2018-10-30 | Discharge: 2018-10-30 | Disposition: A | Discharge status: Home / Self Care | Payer: Medicare Other | Source: Ambulatory Visit | Attending: Family Medicine | Admitting: Family Medicine

## 2018-10-30 ENCOUNTER — Other Ambulatory Visit: Payer: Self-pay

## 2018-10-30 DIAGNOSIS — Z1231 Encounter for screening mammogram for malignant neoplasm of breast: Secondary | ICD-10-CM | POA: Diagnosis not present

## 2018-12-25 ENCOUNTER — Ambulatory Visit: Payer: Medicare Other | Admitting: Family Medicine

## 2019-01-29 ENCOUNTER — Other Ambulatory Visit: Payer: Self-pay

## 2019-01-29 ENCOUNTER — Ambulatory Visit (INDEPENDENT_AMBULATORY_CARE_PROVIDER_SITE_OTHER): Payer: Medicare Other | Admitting: Family Medicine

## 2019-01-29 ENCOUNTER — Encounter: Payer: Self-pay | Admitting: Family Medicine

## 2019-01-29 VITALS — Temp 96.4°F | Ht 63.1 in | Wt 172.1 lb

## 2019-01-29 DIAGNOSIS — E1169 Type 2 diabetes mellitus with other specified complication: Secondary | ICD-10-CM | POA: Diagnosis not present

## 2019-01-29 DIAGNOSIS — Z9119 Patient's noncompliance with other medical treatment and regimen: Secondary | ICD-10-CM

## 2019-01-29 DIAGNOSIS — E785 Hyperlipidemia, unspecified: Secondary | ICD-10-CM | POA: Diagnosis not present

## 2019-01-29 DIAGNOSIS — Z91199 Patient's noncompliance with other medical treatment and regimen due to unspecified reason: Secondary | ICD-10-CM

## 2019-01-29 DIAGNOSIS — I10 Essential (primary) hypertension: Secondary | ICD-10-CM

## 2019-01-29 DIAGNOSIS — E1165 Type 2 diabetes mellitus with hyperglycemia: Secondary | ICD-10-CM | POA: Diagnosis not present

## 2019-01-29 NOTE — Assessment & Plan Note (Signed)
Doesn't believe that she has diabetes. Has a lot of faith in god. Has not taken medicine- stated that it was too expensive. Will get CCM pharmacy involved to see if we can help with cost when we need to restart jardiance.

## 2019-01-29 NOTE — Assessment & Plan Note (Signed)
States that she is feeling well. Off medicine due to not wanting any. Will get her in for vitals and labs. Treat as needed. Call with any concerns.

## 2019-01-29 NOTE — Progress Notes (Signed)
Temp (!) 96.4 F (35.8 C) (Oral)   Ht 5' 3.1" (1.603 m)   Wt 172 lb 1.6 oz (78.1 kg)   BMI 30.39 kg/m    Subjective:    Patient ID: Valerie Prince, female    DOB: [birth year], 68 y.o.   MRN: [id number]  HPI: Valerie Prince is a 68 y.o. female  Chief Complaint  Patient presents with  . Diabetes    f/u   DIABETES- did not end up taking the jardiance due to cost.  Hypoglycemic episodes:no Polydipsia/polyuria: no Visual disturbance: yes Chest pain: no Paresthesias: no Glucose Monitoring: no  Accucheck frequency: Not Checking Taking Insulin?: no Blood Pressure Monitoring: not checking Retinal Examination: up to date Foot Exam: Up to Date Diabetic Education: Not Completed Pneumovax: Refused Influenza: Refused Aspirin: no  HYPERTENSION / HYPERLIPIDEMIA Satisfied with current treatment? yes Duration of hypertension: chronic BP monitoring frequency: not checking BP medication side effects: not on anything Past BP meds: none Duration of hyperlipidemia: chronic Cholesterol medication side effects: not on anything Cholesterol supplements: fish oil Past cholesterol medications: none Medication compliance: not on anything Aspirin: no Recent stressors: yes Recurrent headaches: no Visual changes: no Palpitations: no Dyspnea: no Chest pain: no Lower extremity edema: no Dizzy/lightheaded: no   Relevant past medical, surgical, family and social history reviewed and updated as indicated. Interim medical history since our last visit reviewed. Allergies and medications reviewed and updated.  Review of Systems  Constitutional: Negative.   HENT: Negative.   Eyes: Positive for visual disturbance. Negative for photophobia, pain, discharge, redness and itching.  Respiratory: Negative.   Cardiovascular: Negative.   Musculoskeletal: Negative.   Neurological: Negative.   Psychiatric/Behavioral: Negative.     Per HPI unless specifically indicated above     Objective:    Temp (!) 96.4 F (35.8 C) (Oral)   Ht 5' 3.1" (1.603 m)   Wt 172 lb 1.6 oz (78.1 kg)   BMI 30.39 kg/m   Wt Readings from Last 3 Encounters:  01/29/19 172 lb 1.6 oz (78.1 kg)  09/25/18 178 lb 5 oz (80.9 kg)  07/21/18 172 lb 11.2 oz (78.3 kg)    Physical Exam Vitals signs and nursing note reviewed.  Constitutional:      General: She is not in acute distress.    Appearance: Normal appearance. She is not ill-appearing, toxic-appearing or diaphoretic.  HENT:     Head: Normocephalic and atraumatic.     Right Ear: External ear normal.     Left Ear: External ear normal.     Nose: Nose normal.     Mouth/Throat:     Mouth: Mucous membranes are moist.     Pharynx: Oropharynx is clear.  Eyes:     General: No scleral icterus.       Right eye: No discharge.        Left eye: No discharge.     Conjunctiva/sclera: Conjunctivae normal.     Pupils: Pupils are equal, round, and reactive to light.  Neck:     Musculoskeletal: Normal range of motion.  Pulmonary:     Effort: Pulmonary effort is normal. No respiratory distress.     Comments: Speaking in full sentences Musculoskeletal: Normal range of motion.  Skin:    Coloration: Skin is not jaundiced or pale.     Findings: No bruising, erythema, lesion or rash.  Neurological:     Mental Status: She is alert and oriented to person, place, and time. Mental status is at baseline.  Psychiatric:        Mood and Affect: Mood normal.        Behavior: Behavior normal.        Thought Content: Thought content normal.        Judgment: Judgment normal.     Results for orders placed or performed in visit on 09/25/18  Bayer DCA Hb A1c Waived  Result Value Ref Range   HB A1C (BAYER DCA - WAIVED) 10.1 (H) <7.0 %      Assessment & Plan:   Problem List Items Addressed This Visit      Cardiovascular and Mediastinum   HTN (hypertension) - Primary    States that she is feeling well. Off medicine due to not wanting any. Will get her in for vitals  and labs. Treat as needed. Call with any concerns.       Relevant Orders   Comprehensive metabolic panel     Endocrine   Uncontrolled type 2 diabetes mellitus with hyperglycemia ([place])    States that she is feeling well. Off medicine due to not wanting any/cost. Last A1c 10.1. Will get her in for vitals and labs. Treat as needed. Call with any concerns.       Relevant Orders   Bayer DCA Hb A1c Waived   Comprehensive metabolic panel   Referral to Chronic Care Management Services   Hyperlipidemia associated with type 2 diabetes mellitus ([place])    States that she is feeling well. Off medicine due to not wanting any. Will get her in for vitals and labs. Treat as needed. Call with any concerns.       Relevant Orders   Comprehensive metabolic panel   Lipid Panel w/o Chol/HDL Ratio     Other   Non-compliance    Doesn't believe that she has diabetes. Has a lot of faith in god. Has not taken medicine- stated that it was too expensive. Will get CCM pharmacy involved to see if we can help with cost when we need to restart jardiance.       Relevant Orders   Referral to Chronic Care Management Services       Follow up plan: Return in about 3 months (around 05/01/2019) for DM follow up.   . This visit was completed via telephone due to the restrictions of the COVID-19 pandemic. All issues as above were discussed and addressed but no physical exam was performed. If it was felt that the patient should be evaluated in the office, they were directed there. The patient verbally consented to this visit. Patient was unable to complete an audio/visual visit due to Lack of equipment. Due to the catastrophic nature of the COVID-19 pandemic, this visit was done through audio contact only. . Location of the patient: home . Location of the provider: work . Those involved with this call:  . Provider: Park Liter, DO . CMA: Lesle Chris, [place] . Front Desk/Registration: Don Perking  . Time spent  on call: 25 minutes on the phone discussing health concerns. 40 minutes total spent in review of patient's record and preparation of their chart.

## 2019-01-29 NOTE — Assessment & Plan Note (Signed)
States that she is feeling well. Off medicine due to not wanting any/cost. Last A1c 10.1. Will get her in for vitals and labs. Treat as needed. Call with any concerns.

## 2019-02-03 ENCOUNTER — Other Ambulatory Visit: Payer: Medicare Other

## 2019-02-03 ENCOUNTER — Other Ambulatory Visit: Payer: Self-pay

## 2019-02-03 VITALS — BP 160/96 | HR 90 | Temp 97.2°F | Wt 172.0 lb

## 2019-02-03 DIAGNOSIS — E785 Hyperlipidemia, unspecified: Secondary | ICD-10-CM

## 2019-02-03 DIAGNOSIS — I10 Essential (primary) hypertension: Secondary | ICD-10-CM

## 2019-02-03 DIAGNOSIS — E1165 Type 2 diabetes mellitus with hyperglycemia: Secondary | ICD-10-CM | POA: Diagnosis not present

## 2019-02-03 DIAGNOSIS — E1169 Type 2 diabetes mellitus with other specified complication: Secondary | ICD-10-CM | POA: Diagnosis not present

## 2019-02-03 LAB — BAYER DCA HB A1C WAIVED: HB A1C (BAYER DCA - WAIVED): 11.5 % — ABNORMAL HIGH (ref <7.0)

## 2019-02-04 LAB — COMPREHENSIVE METABOLIC PANEL
ALT: 28 IU/L (ref 0–32)
AST: 24 IU/L (ref 0–40)
Albumin/Globulin Ratio: 1.6 (ref 1.2–2.2)
Albumin: 4.4 g/dL (ref 3.8–4.8)
Alkaline Phosphatase: 85 IU/L (ref 39–117)
BUN/Creatinine Ratio: 10 — ABNORMAL LOW (ref 12–28)
BUN: 10 mg/dL (ref 8–27)
Bilirubin Total: 0.9 mg/dL (ref 0.0–1.2)
CO2: 24 mmol/L (ref 20–29)
Calcium: 9.9 mg/dL (ref 8.7–10.3)
Chloride: 96 mmol/L (ref 96–106)
Creatinine, Ser: 1 mg/dL (ref 0.57–1.00)
GFR calc Af Amer: 68 mL/min/{1.73_m2} (ref >59)
GFR calc non Af Amer: 59 mL/min/{1.73_m2} — ABNORMAL LOW (ref >59)
Globulin, Total: 2.7 g/dL (ref 1.5–4.5)
Glucose: 216 mg/dL — ABNORMAL HIGH (ref 65–99)
Potassium: 4 mmol/L (ref 3.5–5.2)
Sodium: 137 mmol/L (ref 134–144)
Total Protein: 7.1 g/dL (ref 6.0–8.5)

## 2019-02-04 LAB — LIPID PANEL W/O CHOL/HDL RATIO
Cholesterol, Total: 341 mg/dL — ABNORMAL HIGH (ref 100–199)
HDL: 46 mg/dL (ref >39)
LDL Calculated: 233 mg/dL — ABNORMAL HIGH (ref 0–99)
Triglycerides: 308 mg/dL — ABNORMAL HIGH (ref 0–149)
VLDL Cholesterol Cal: 62 mg/dL — ABNORMAL HIGH (ref 5–40)

## 2019-02-10 ENCOUNTER — Ambulatory Visit: Payer: Medicare Other | Admitting: Physical Therapy

## 2019-02-10 ENCOUNTER — Encounter: Payer: Self-pay | Admitting: Family Medicine

## 2019-02-11 ENCOUNTER — Telehealth: Payer: Self-pay | Admitting: Family Medicine

## 2019-02-11 NOTE — Telephone Encounter (Signed)
Message relayed to patient. Verbalized understanding and denied questions.   

## 2019-02-11 NOTE — Telephone Encounter (Signed)
Letter sent to patient. Please schedule follow up

## 2019-02-11 NOTE — Telephone Encounter (Signed)
Copied from [place] [id number]. Topic: General - Other >> Feb 10, 2019 12:56 PM Leward Quan A wrote: Reason for CRM: Patient request a called to request a copy of her labs sent to her in the mail. Also would like a call back to schedule a three month follow up visit. Ph# [phone] >> Feb 10, 2019  1:36 PM Jill Side wrote: Pt is wanting labs sent to her by mail. Please release.

## 2019-02-12 DIAGNOSIS — H2513 Age-related nuclear cataract, bilateral: Secondary | ICD-10-CM | POA: Diagnosis not present

## 2019-02-12 LAB — HM DIABETES EYE EXAM

## 2019-02-18 ENCOUNTER — Ambulatory Visit: Payer: Medicare Other | Admitting: Pharmacist

## 2019-02-18 DIAGNOSIS — E1165 Type 2 diabetes mellitus with hyperglycemia: Secondary | ICD-10-CM

## 2019-02-18 NOTE — Patient Instructions (Signed)
Visit Information  Goals Addressed            This Visit's Progress     Patient Stated   . "I don't want to take medications" (pt-stated)       Current Barriers:  . Uncontrolled T2DM, last A1c 11.5%; though dislikes the label of "diabetes" . Extreme distrust of healthcare system and traditional evidence-based medicine, from a long history of being given medications without adequate explanation, and not seeing significant benefit and having side effects. She believes hat being prescribed metformin years ago contributed to worsening sugar control. . Denies typical symptoms of hyperglycemia: polyuria, polydipsia, polyphagia, nocturia, peripheral neuropathy (endorses having sensation in feet), retinopathy (though notes blurred vision, but believes it was related to eye drops given to her by ophthalmology); [name] Chose not to take Jardiance; notes the $45 copay would have been manageable, but she has always felt that medications make her feel worse . Reports that she checks BG at home, and reports fastings ~130s and post prandials ~180s. Notes that a "diabetes doctor" at one time recommended blood sugars around 150 be her goal. Glucose on last BMP here was 216 . Reports continuing to be active - cares for her home and yard, uses a push mower, walks often . Focuses on reduced carbohydrate intake - boiled eggs + coffee w/ artificial sweetener for breakfast, often baked/broiled foods and vegetables with small starch servings  Pharmacist Clinical Goal(s):  [name] Over the next 90 days, patient will work with PharmD and primary care provider to address needs related to patient-centered optimized health maintenance   Interventions: . Intensive discussion regarding micro and macrovascular complications of uncontrolled blood sugars. Discussed progressive nature of beta cell decline in diabetes, even with optimized lifestyle choices. Patient promises that if she develops any symptoms I described, she will contact  me or the office to discuss options for treatment o Patient notes that she accepts full personal responsibility for any negative health consequences of declining pharmacotherapy  o Refuses metformin. I am concerned that genitourinary symptoms from Jardiance initiation may be interpreted as negative outcomes by the patient if she was to start Jardiance, even if this agent may have the strongest recommendation for use with her CKD. If she ever is amenable to agents, recommend focused patient centered risk vs benefit discussion of options; may consider DPP4 vs low dose pioglitazone for lowest incidence of side effects . Discussed that abrupt lowering of blood sugars can cause symptoms, which may explain why she felt poorly after medications previously . Praised for her intense focus on diet/lifestyle modifications to control blood sugar . Discussed that older glucometers may not measure glucose as accurately. Patient is unsure how long she has had her current meter, but believes ~1 year.  . Discussed goal fasting is <130 and goal post prandial <180, which is where "average" goal of <150 came from. She notes that she will continue to monitor .   Patient Self Care Activities:  . Calls provider office for new concerns or questions  Initial goal documentation        Ms. Marschall was given information about Chronic Care Management services today including:  1. CCM service includes personalized support from designated clinical staff supervised by her physician, including individualized plan of care and coordination with other care providers 2. 24/7 contact phone numbers for assistance for urgent and routine care needs. 3. Service will only be billed when office clinical staff spend 20 minutes or more in a month to coordinate  care. 4. Only one practitioner may furnish and bill the service in a calendar month. 5. The patient may stop CCM services at any time (effective at the end of the month) by phone  call to the office staff. 6. The patient will be responsible for cost sharing (co-pay) of up to 20% of the service fee (after annual deductible is met).  Patient agreed to services and verbal consent obtained.   The patient verbalized understanding of instructions provided today and declined a print copy of patient instruction materials.    Plan: - Patient has my contact information for future questions or concerns related to her health - PharmD will continue to follow to support the patient with chronic health needs   Catie Darnelle Maffucci, PharmD Clinical Pharmacist [place] [phone]

## 2019-02-18 NOTE — Chronic Care Management (AMB) (Signed)
Chronic Care Management   Note  02/18/2019 Name: Valerie Prince MRN: [id number] DOB: November 16, 1950   Subjective:  Valerie Prince is a 68 y.o. year old female who is a primary care patient of Valerie Roys, DO. The CCM team was consulted for assistance with chronic disease management and care coordination needs.    Contacted patient telephonically to discuss chronic care management and health maintenance.    [name] was given information about Chronic Care Management services today including:  1. CCM service includes personalized support from designated clinical staff supervised by her physician, including individualized plan of care and coordination with other care providers 2. 24/7 contact phone numbers for assistance for urgent and routine care needs. 3. Service will only be billed when office clinical staff spend 20 minutes or more in a month to coordinate care. 4. Only one practitioner may furnish and bill the service in a calendar month. 5. The patient may stop CCM services at any time (effective at the end of the month) by phone call to the office staff. 6. The patient will be responsible for cost sharing (co-pay) of up to 20% of the service fee (after annual deductible is met).  Patient agreed to services and verbal consent obtained.   Review of patient status, including review of consultants reports, laboratory and other test data, was performed as part of comprehensive evaluation and provision of chronic care management services.   Objective:  Lab Results  Component Value Date   CREATININE 1.00 02/03/2019   CREATININE 1.07 (H) 07/21/2018   CREATININE 0.97 06/18/2018    Lab Results  Component Value Date   HGBA1C 11.5 (H) 02/03/2019       Component Value Date/Time   CHOL 341 (H) 02/03/2019 0937   TRIG 308 (H) 02/03/2019 0937   HDL 46 02/03/2019 0937   LDLCALC 233 (H) 02/03/2019 0937    Clinical ASCVD: No  The ASCVD Risk score [name], et al., 2013)  failed to calculate for the following reasons:   The valid total cholesterol range is 130 to 320 mg/dL    BP Readings from Last 3 Encounters:  02/03/19 (!) 160/96  09/25/18 (!) 148/88  07/21/18 (!) 148/89    Allergies  Allergen Reactions  . Codeine Nausea And Vomiting    Medications Reviewed Today    Reviewed by Valerie Roys, DO (Physician) on 01/29/19 at 1450  Med List Status: <None>  Medication Order Taking? Sig Documenting Provider Last Dose Status Informant  BIOTIN PO [id number] Yes Take by mouth. Twice a week. 1040m tablet [provider] Taking Active Self  cholecalciferol (VITAMIN D) 1000 units tablet [id number] Take 125 Units by mouth daily. [provider] Taking Active   omega-3 fish oil (MAXEPA) 1000 MG CAPS capsule [id number] Take by mouth. [provider] Taking Active   vitamin B-12 (CYANOCOBALAMIN) 1000 MCG tablet [id number] Take 1,000 mcg by mouth daily. [provider] Taking Active          Assessment:   Goals Addressed            This Visit's Progress     Patient Stated   . "I don't want to take medications" (pt-stated)       Current Barriers:  . Uncontrolled T2DM, last A1c 11.5%; though dislikes the label of "diabetes" . Extreme distrust of healthcare system and traditional evidence-based medicine, from a long history of being given medications without adequate explanation, and not seeing significant benefit  and having side effects. She believes hat being prescribed metformin years ago contributed to worsening sugar control. . Denies typical symptoms of hyperglycemia: polyuria, polydipsia, polyphagia, nocturia, peripheral neuropathy (endorses having sensation in feet), retinopathy (though notes blurred vision, but believes it was related to eye drops given to her by ophthalmology); [name] Chose not to take Jardiance; notes the $45 copay would have been manageable, but she has always felt that medications make her  feel worse . Reports that she checks BG at home, and reports fastings ~130s and post prandials ~180s. Notes that a "diabetes doctor" at one time recommended blood sugars around 150 be her goal. Glucose on last BMP here was 216 . Reports continuing to be active - cares for her home and yard, uses a push mower, walks often . Focuses on reduced carbohydrate intake - boiled eggs + coffee w/ artificial sweetener for breakfast, often baked/broiled foods and vegetables with small starch servings  Pharmacist Clinical Goal(s):  [name] Over the next 90 days, patient will work with PharmD and primary care provider to address needs related to patient-centered optimized health maintenance   Interventions: . Intensive discussion regarding micro and macrovascular complications of uncontrolled blood sugars. Discussed progressive nature of beta cell decline in diabetes, even with optimized lifestyle choices. Patient promises that if she develops any symptoms I described, she will contact me or the office to discuss options for treatment o Patient notes that she accepts full personal responsibility for any negative health consequences of declining pharmacotherapy  o Refuses metformin. I am concerned that genitourinary symptoms from Jardiance initiation may be interpreted as negative outcomes by the patient if she was to start Jardiance, even if this agent may have the strongest recommendation for use with her CKD. If she ever is amenable to agents, recommend focused patient centered risk vs benefit discussion of options; may consider DPP4 vs low dose pioglitazone for lowest incidence of side effects . Discussed that abrupt lowering of blood sugars can cause symptoms, which may explain why she felt poorly after medications previously . Praised for her intense focus on diet/lifestyle modifications to control blood sugar . Discussed that older glucometers may not measure glucose as accurately. Patient is unsure how long she has  had her current meter, but believes ~1 year.  . Discussed goal fasting is <130 and goal post prandial <180, which is where "average" goal of <150 came from. She notes that she will continue to monitor .   Patient Self Care Activities:  . Calls provider office for new concerns or questions  Initial goal documentation        Plan: - Patient has my contact information for future questions or concerns related to her health - PharmD will continue to follow to support the patient with chronic health needs   Catie Darnelle Maffucci, PharmD Clinical Pharmacist [place] [phone]

## 2019-05-13 ENCOUNTER — Ambulatory Visit: Payer: Medicare Other | Admitting: Family Medicine

## 2019-06-20 ENCOUNTER — Ambulatory Visit: Admit: 2019-06-20 | Payer: Medicare Other | Admitting: Ophthalmology

## 2019-06-20 SURGERY — PHACOEMULSIFICATION, CATARACT, WITH IOL INSERTION
Anesthesia: Topical | Laterality: Left

## 2019-08-24 ENCOUNTER — Telehealth: Payer: Self-pay | Admitting: Family Medicine

## 2019-08-24 NOTE — Chronic Care Management (AMB) (Signed)
  Care Management   Note  08/24/2019 Name: Valerie Prince MRN: [id number] DOB: 12-04-1950  Valerie Prince is a 69 y.o. year old female who is a primary care patient of Valerie Roys, DO and is actively engaged with the care management team. I reached out to [name] by phone today to assist with scheduling a follow up appointment with the Pharmacist  Follow up plan: The care management team will reach out to the patient again over the next 7 days. , A HIPPA compliant phone message was left for the patient providing contact information and requesting a return call.  and If patient returns call to provider office, please advise to call [facility] [place]  [place], [place] [address] Direct Dial: [email or web address]  Website: [email or web address]

## 2020-02-19 DIAGNOSIS — E119 Type 2 diabetes mellitus without complications: Secondary | ICD-10-CM | POA: Diagnosis not present

## 2020-02-19 LAB — HM DIABETES EYE EXAM

## 2020-03-08 DIAGNOSIS — R609 Edema, unspecified: Secondary | ICD-10-CM | POA: Insufficient documentation

## 2020-03-08 DIAGNOSIS — M19019 Primary osteoarthritis, unspecified shoulder: Secondary | ICD-10-CM | POA: Insufficient documentation

## 2020-03-08 DIAGNOSIS — M5412 Radiculopathy, cervical region: Secondary | ICD-10-CM | POA: Insufficient documentation

## 2020-03-08 DIAGNOSIS — G5600 Carpal tunnel syndrome, unspecified upper limb: Secondary | ICD-10-CM | POA: Insufficient documentation

## 2020-03-09 ENCOUNTER — Other Ambulatory Visit: Payer: Self-pay

## 2020-03-09 ENCOUNTER — Ambulatory Visit (INDEPENDENT_AMBULATORY_CARE_PROVIDER_SITE_OTHER): Payer: Medicare Other | Admitting: Internal Medicine

## 2020-03-09 ENCOUNTER — Encounter: Payer: Self-pay | Admitting: Internal Medicine

## 2020-03-09 ENCOUNTER — Other Ambulatory Visit: Payer: Self-pay | Admitting: Internal Medicine

## 2020-03-09 VITALS — BP 139/89 | HR 86 | Temp 98.7°F | Ht 63.1 in | Wt 170.0 lb

## 2020-03-09 DIAGNOSIS — I10 Essential (primary) hypertension: Secondary | ICD-10-CM

## 2020-03-09 DIAGNOSIS — E1165 Type 2 diabetes mellitus with hyperglycemia: Secondary | ICD-10-CM | POA: Diagnosis not present

## 2020-03-09 DIAGNOSIS — M47816 Spondylosis without myelopathy or radiculopathy, lumbar region: Secondary | ICD-10-CM | POA: Diagnosis not present

## 2020-03-09 DIAGNOSIS — M19012 Primary osteoarthritis, left shoulder: Secondary | ICD-10-CM | POA: Diagnosis not present

## 2020-03-09 DIAGNOSIS — Z0000 Encounter for general adult medical examination without abnormal findings: Secondary | ICD-10-CM | POA: Diagnosis not present

## 2020-03-09 LAB — POCT URINALYSIS DIPSTICK
Bilirubin, UA: NEGATIVE
Blood, UA: NEGATIVE
Glucose, UA: POSITIVE — AB
Ketones, UA: NEGATIVE
Leukocytes, UA: NEGATIVE
Nitrite, UA: NEGATIVE
Protein, UA: NEGATIVE
Spec Grav, UA: <=1.005 — AB (ref 1.010–1.025)
Urobilinogen, UA: 0.2 E.U./dL
pH, UA: 5 (ref 5.0–8.0)

## 2020-03-09 NOTE — Patient Instructions (Signed)
DASH Eating Plan DASH stands for "Dietary Approaches to Stop Hypertension." The DASH eating plan is a healthy eating plan that has been shown to reduce high blood pressure (hypertension). It may also reduce your risk for type 2 diabetes, heart disease, and stroke. The DASH eating plan may also help with weight loss. What are tips for following this plan?  General guidelines  Avoid eating more than 2,300 mg (milligrams) of salt (sodium) a day. If you have hypertension, you may need to reduce your sodium intake to 1,500 mg a day.  Limit alcohol intake to no more than 1 drink a day for nonpregnant women and 2 drinks a day for men. One drink equals 12 oz of beer, 5 oz of wine, or 1 oz of hard liquor.  Work with your health care provider to maintain a healthy body weight or to lose weight. Ask what an ideal weight is for you.  Get at least 30 minutes of exercise that causes your heart to beat faster (aerobic exercise) most days of the week. Activities may include walking, swimming, or biking.  Work with your health care provider or diet and nutrition specialist (dietitian) to adjust your eating plan to your individual calorie needs. Reading food labels   Check food labels for the amount of sodium per serving. Choose foods with less than 5 percent of the Daily Value of sodium. Generally, foods with less than 300 mg of sodium per serving fit into this eating plan.  To find whole grains, look for the word "whole" as the first word in the ingredient list. Shopping  Buy products labeled as "low-sodium" or "no salt added."  Buy fresh foods. Avoid canned foods and premade or frozen meals. Cooking  Avoid adding salt when cooking. Use salt-free seasonings or herbs instead of table salt or sea salt. Check with your health care provider or pharmacist before using salt substitutes.  Do not fry foods. Cook foods using healthy methods such as baking, boiling, grilling, and broiling instead.  Cook with  heart-healthy oils, such as olive, canola, soybean, or sunflower oil. Meal planning  Eat a balanced diet that includes: ? 5 or more servings of fruits and vegetables each day. At each meal, try to fill half of your plate with fruits and vegetables. ? Up to 6-8 servings of whole grains each day. ? Less than 6 oz of lean meat, poultry, or fish each day. A 3-oz serving of meat is about the same size as a deck of cards. One egg equals 1 oz. ? 2 servings of low-fat dairy each day. ? A serving of nuts, seeds, or beans 5 times each week. ? Heart-healthy fats. Healthy fats called Omega-3 fatty acids are found in foods such as flaxseeds and coldwater fish, like sardines, salmon, and mackerel.  Limit how much you eat of the following: ? Canned or prepackaged foods. ? Food that is high in trans fat, such as fried foods. ? Food that is high in saturated fat, such as fatty meat. ? Sweets, desserts, sugary drinks, and other foods with added sugar. ? Full-fat dairy products.  Do not salt foods before eating.  Try to eat at least 2 vegetarian meals each week.  Eat more home-cooked food and less restaurant, buffet, and fast food.  When eating at a restaurant, ask that your food be prepared with less salt or no salt, if possible. What foods are recommended? The items listed may not be a complete list. Talk with your dietitian about   what dietary choices are best for you. Grains Whole-grain or whole-wheat bread. Whole-grain or whole-wheat pasta. Brown rice. Oatmeal. Quinoa. Bulgur. Whole-grain and low-sodium cereals. Pita bread. Low-fat, low-sodium crackers. Whole-wheat flour tortillas. Vegetables Fresh or frozen vegetables (raw, steamed, roasted, or grilled). Low-sodium or reduced-sodium tomato and vegetable juice. Low-sodium or reduced-sodium tomato sauce and tomato paste. Low-sodium or reduced-sodium canned vegetables. Fruits All fresh, dried, or frozen fruit. Canned fruit in natural juice (without  added sugar). Meat and other protein foods Skinless chicken or turkey. Ground chicken or turkey. Pork with fat trimmed off. Fish and seafood. Egg whites. Dried beans, peas, or lentils. Unsalted nuts, nut butters, and seeds. Unsalted canned beans. Lean cuts of beef with fat trimmed off. Low-sodium, lean deli meat. Dairy Low-fat (1%) or fat-free (skim) milk. Fat-free, low-fat, or reduced-fat cheeses. Nonfat, low-sodium ricotta or cottage cheese. Low-fat or nonfat yogurt. Low-fat, low-sodium cheese. Fats and oils Soft margarine without trans fats. Vegetable oil. Low-fat, reduced-fat, or light mayonnaise and salad dressings (reduced-sodium). Canola, safflower, olive, soybean, and sunflower oils. Avocado. Seasoning and other foods Herbs. Spices. Seasoning mixes without salt. Unsalted popcorn and pretzels. Fat-free sweets. What foods are not recommended? The items listed may not be a complete list. Talk with your dietitian about what dietary choices are best for you. Grains Baked goods made with fat, such as croissants, muffins, or some breads. Dry pasta or rice meal packs. Vegetables Creamed or fried vegetables. Vegetables in a cheese sauce. Regular canned vegetables (not low-sodium or reduced-sodium). Regular canned tomato sauce and paste (not low-sodium or reduced-sodium). Regular tomato and vegetable juice (not low-sodium or reduced-sodium). Pickles. Olives. Fruits Canned fruit in a light or heavy syrup. Fried fruit. Fruit in cream or butter sauce. Meat and other protein foods Fatty cuts of meat. Ribs. Fried meat. Bacon. Sausage. Bologna and other processed lunch meats. Salami. Fatback. Hotdogs. Bratwurst. Salted nuts and seeds. Canned beans with added salt. Canned or smoked fish. Whole eggs or egg yolks. Chicken or turkey with skin. Dairy Whole or 2% milk, cream, and half-and-half. Whole or full-fat cream cheese. Whole-fat or sweetened yogurt. Full-fat cheese. Nondairy creamers. Whipped toppings.  Processed cheese and cheese spreads. Fats and oils Butter. Stick margarine. Lard. Shortening. Ghee. Bacon fat. Tropical oils, such as coconut, palm kernel, or palm oil. Seasoning and other foods Salted popcorn and pretzels. Onion salt, garlic salt, seasoned salt, table salt, and sea salt. Worcestershire sauce. Tartar sauce. Barbecue sauce. Teriyaki sauce. Soy sauce, including reduced-sodium. Steak sauce. Canned and packaged gravies. Fish sauce. Oyster sauce. Cocktail sauce. Horseradish that you find on the shelf. Ketchup. Mustard. Meat flavorings and tenderizers. Bouillon cubes. Hot sauce and Tabasco sauce. Premade or packaged marinades. Premade or packaged taco seasonings. Relishes. Regular salad dressings. Where to find more information:  National Heart, Lung, and Blood Institute: www.nhlbi.nih.gov  American Heart Association: www.heart.org Summary  The DASH eating plan is a healthy eating plan that has been shown to reduce high blood pressure (hypertension). It may also reduce your risk for type 2 diabetes, heart disease, and stroke.  With the DASH eating plan, you should limit salt (sodium) intake to 2,300 mg a day. If you have hypertension, you may need to reduce your sodium intake to 1,500 mg a day.  When on the DASH eating plan, aim to eat more fresh fruits and vegetables, whole grains, lean proteins, low-fat dairy, and heart-healthy fats.  Work with your health care provider or diet and nutrition specialist (dietitian) to adjust your eating plan to your   individual calorie needs. This information is not intended to replace advice given to you by your health care provider. Make sure you discuss any questions you have with your health care provider. Document Revised: 07/19/2017 Document Reviewed: 07/30/2016 Elsevier Patient Education  2020 Elsevier Inc.  

## 2020-03-09 NOTE — Progress Notes (Signed)
Date:  03/09/2020   Name:  Valerie Prince   DOB:  25-Feb-1951   MRN:  [id number]   Chief Complaint: Establish Care and Shoulder Pain (x2012, hurts everyday at night, want ibuprofen)  Valerie Prince is a 69 y.o. female who presents today for her Complete Annual Exam. She feels fairly well. She reports exercising none. She reports she is sleeping fairly well. Breast complaints none.  Mammogram: 10/2018 wants to do every 2 yrs DEXA: none Pap smear: discontinued Colonoscopy: 04/2016   There is no immunization history on file for this patient. She declines all immunizations.   Shoulder Pain  The pain is present in the left shoulder. This is a chronic problem. Episode onset: around 2012 torn rotator cuff. There has been a history of trauma. The problem occurs daily. The problem has been waxing and waning. The quality of the pain is described as aching. Pertinent negatives include no fever. The symptoms are aggravated by activity. She has tried NSAIDS (hx of surgery 2012) for the symptoms. The treatment provided moderate relief.  Hypertension This is a chronic problem. The problem has been waxing and waning since onset. The problem is uncontrolled (up to 170/80 at home and at other times it is normal). Pertinent negatives include no blurred vision, chest pain, headaches, palpitations or shortness of breath. There are no associated agents to hypertension. Past treatments include nothing.  Diabetes She has type 2 diabetes mellitus. The initial diagnosis of diabetes was made 13 years ago. There are no hypoglycemic associated symptoms. Pertinent negatives for hypoglycemia include no dizziness, headaches, nervousness/anxiousness or tremors. (May have had low BS after taking metformin) Pertinent negatives for diabetes include no blurred vision, no chest pain, no fatigue, no foot paresthesias, no foot ulcerations, no polydipsia, no polyuria, no weakness and no weight loss. Current diabetic treatment  includes diet. She is compliant with treatment most of the time. Her weight is stable. She is following a generally healthy diet. She monitors blood glucose at home 3-4 x per week. Her home blood glucose trend is fluctuating dramatically (range from 90 to 200). An ACE inhibitor/angiotensin II receptor blocker is not being taken. Eye exam is current.  EXAM:  DG SHOULDER 3+VIEWS LEFT   COMPARISON:  None.   FINDINGS:  The left shoulder is located and there is no evidence for a  fracture. Mild spurring along the tip of the acromion. Visualized  ribs are intact.   IMPRESSION:  No acute bone abnormality.   Lab Results  Component Value Date   CREATININE 1.00 02/03/2019   BUN 10 02/03/2019   NA 137 02/03/2019   K 4.0 02/03/2019   CL 96 02/03/2019   CO2 24 02/03/2019   Lab Results  Component Value Date   CHOL 341 (H) 02/03/2019   HDL 46 02/03/2019   LDLCALC 233 (H) 02/03/2019   TRIG 308 (H) 02/03/2019   Lab Results  Component Value Date   TSH 1.820 06/18/2018   Lab Results  Component Value Date   HGBA1C 11.5 (H) 02/03/2019   Lab Results  Component Value Date   WBC 8.6 06/18/2018   HGB 13.8 06/18/2018   HCT 41.4 06/18/2018   MCV 89 06/18/2018   PLT 304 06/18/2018   Lab Results  Component Value Date   ALT 28 02/03/2019   AST 24 02/03/2019   ALKPHOS 85 02/03/2019   BILITOT 0.9 02/03/2019     Review of Systems  Constitutional: Negative for appetite change, fatigue, fever, unexpected  weight change and weight loss.  Eyes: Negative for blurred vision.  Respiratory: Negative for cough, chest tightness, shortness of breath and wheezing.   Cardiovascular: Negative for chest pain, palpitations and leg swelling.  Gastrointestinal: Negative for abdominal pain, constipation and diarrhea.  Endocrine: Negative for polydipsia and polyuria.  Genitourinary: Negative for difficulty urinating, frequency and urgency.  Musculoskeletal: Positive for arthralgias. Negative for back  pain and gait problem.  Neurological: Negative for dizziness, tremors, weakness, light-headedness and headaches.  Psychiatric/Behavioral: Negative for dysphoric mood and sleep disturbance. The patient is not nervous/anxious.     Patient Active Problem List   Diagnosis Date Noted  . Lumbar spondylosis 03/09/2020  . Carpal tunnel syndrome 03/08/2020  . Cervical radiculopathy 03/08/2020  . Edema 03/08/2020  . Localized, primary osteoarthritis of shoulder region 03/08/2020  . HTN (hypertension) 06/18/2018  . Hyperlipidemia associated with type 2 diabetes mellitus ([facility]) 06/18/2018  . Non-compliance 06/18/2018  . Uncontrolled type 2 diabetes mellitus with hyperglycemia ([facility]) 06/17/2018    Allergies  Allergen Reactions  . Codeine Nausea And Vomiting    Past Surgical History:  Procedure Laterality Date  . ABDOMINAL HYSTERECTOMY    . COLONOSCOPY WITH PROPOFOL N/A 05/17/2016   Procedure: COLONOSCOPY WITH PROPOFOL;  Surgeon: [name], MD;  Location: [facility];  Service: Endoscopy;  Laterality: N/A;  PLEASE KEEP PT ARRIVAL TIME 10 OR AFTER  . POLYPECTOMY  05/17/2016   Procedure: POLYPECTOMY;  Surgeon: [name], MD;  Location: [facility];  Service: Endoscopy;;  . ROTATOR CUFF REPAIR Left 2012    Social History   Tobacco Use  . Smoking status: Former Smoker    Packs/day: 0.50    Years: 32.00    Pack years: 16.00    Types: Cigarettes    Quit date: 03/20/2005    Years since quitting: 14.9  . Smokeless tobacco: Never Used  Vaping Use  . Vaping Use: Never used  Substance Use Topics  . Alcohol use: Yes    Comment: 3 x year - wine  . Drug use: Never     Medication list has been reviewed and updated.  Current Meds  Medication Sig  . BIOTIN PO Take by mouth. Twice a week. 1000mg  tablet  . cholecalciferol (VITAMIN D) 1000 units tablet Take 125 Units by mouth daily.  [name] omega-3 fish oil (MAXEPA) 1000 MG CAPS capsule Take by mouth.  . vitamin B-12  (CYANOCOBALAMIN) 1000 MCG tablet Take 1,000 mcg by mouth daily.    PHQ 2/9 Scores 03/09/2020 06/18/2018 04/13/2016  PHQ - 2 Score 0 0 0  PHQ- 9 Score 0 0 -    GAD 7 : Generalized Anxiety Score 03/09/2020  Nervous, Anxious, on Edge 0  Control/stop worrying 0  Worry too much - different things 0  Trouble relaxing 0  Restless 0  Easily annoyed or irritable 0  Afraid - awful might happen 0  Total GAD 7 Score 0  Anxiety Difficulty Not difficult at all    BP Readings from Last 3 Encounters:  03/09/20 139/90  02/03/19 (!) 160/96  09/25/18 (!) 148/88    Physical Exam Vitals and nursing note reviewed.  Constitutional:      General: She is not in acute distress.    Appearance: She is well-developed.  HENT:     Head: Normocephalic and atraumatic.     Right Ear: Tympanic membrane and ear canal normal.     Left Ear: Tympanic membrane and ear canal normal.     Nose:  Right Sinus: No maxillary sinus tenderness.     Left Sinus: No maxillary sinus tenderness.  Neck:     Thyroid: No thyromegaly.     Vascular: No carotid bruit.  Cardiovascular:     Rate and Rhythm: Normal rate and regular rhythm.     Pulses: Normal pulses.     Heart sounds: Normal heart sounds.  Pulmonary:     Effort: Pulmonary effort is normal. No respiratory distress.     Breath sounds: No wheezing or rhonchi.  Abdominal:     General: Bowel sounds are normal.     Palpations: Abdomen is soft.     Tenderness: There is no abdominal tenderness. There is no guarding or rebound.  Musculoskeletal:     Right shoulder: No crepitus. Normal range of motion. Normal strength.     Left shoulder: Bony tenderness present. No crepitus. Decreased range of motion. Normal strength.     Cervical back: Normal range of motion. No erythema.     Lumbar back: Tenderness present. No spasms. Decreased range of motion.     Right lower leg: No edema.     Left lower leg: No edema.  Lymphadenopathy:     Cervical: No cervical adenopathy.    Skin:    General: Skin is warm and dry.     Capillary Refill: Capillary refill takes less than 2 seconds.     Findings: No rash.  Neurological:     General: No focal deficit present.     Mental Status: She is alert and oriented to person, place, and time.     Cranial Nerves: No cranial nerve deficit.     Sensory: No sensory deficit.     Deep Tendon Reflexes: Reflexes are normal and symmetric.  Psychiatric:        Attention and Perception: Attention normal.        Mood and Affect: Mood normal.        Speech: Speech normal.     Wt Readings from Last 3 Encounters:  03/09/20 170 lb (77.1 kg)  02/03/19 172 lb (78 kg)  01/29/19 172 lb 1.6 oz (78.1 kg)    BP 139/90   Pulse (!) 112   Temp 98.7 F (37.1 C) (Oral)   Ht 5' 3.1" (1.603 m)   Wt 170 lb (77.1 kg)   SpO2 98%   BMI 30.02 kg/m   Assessment and Plan: 1. Annual physical exam Mammogram next year (q2 yr interval) Does not qualify for LDCT lung cancer screening Declines all immunizatioins  2. Essential hypertension BP is borderline here today - pt will continue low sodium diet and exercise Monitor BP at home and follow up if persistently > 150/90 - CBC with Differential/Platelet - TSH - POCT urinalysis dipstick  3. Uncontrolled type 2 diabetes mellitus with hyperglycemia (HCC) Last A1C was elevated;  Pt has apparently had uncontrolled DM for a number of years due to medication refusal.  We discussed the need to avoid complications such as kidney failure, CVA and MI that can result.  Will obtain labs and then see if she is willing to try medication. - Comprehensive metabolic panel - Hemoglobin A1c - Lipid panel - Microalbumin / creatinine urine ratio  4. 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 Lumbar spondylosis Severe DDD at L4-5 and L5-S1 seen on previous MRI Handicapped parking placard  application completed.   Partially dictated using  Editor, commissioning. Any errors are unintentional.  Halina Maidens, MD [facility]  03/09/2020

## 2020-03-10 LAB — CBC WITH DIFFERENTIAL/PLATELET
Basophils Absolute: 0 10*3/uL (ref 0.0–0.2)
Basos: 1 %
EOS (ABSOLUTE): 0.1 10*3/uL (ref 0.0–0.4)
Eos: 1 %
Hematocrit: 41.8 % (ref 34.0–46.6)
Hemoglobin: 14.4 g/dL (ref 11.1–15.9)
Immature Grans (Abs): 0 10*3/uL (ref 0.0–0.1)
Immature Granulocytes: 0 %
Lymphocytes Absolute: 2.9 10*3/uL (ref 0.7–3.1)
Lymphs: 37 %
MCH: 29.8 pg (ref 26.6–33.0)
MCHC: 34.4 g/dL (ref 31.5–35.7)
MCV: 87 fL (ref 79–97)
Monocytes Absolute: 0.5 10*3/uL (ref 0.1–0.9)
Monocytes: 7 %
Neutrophils Absolute: 4.3 10*3/uL (ref 1.4–7.0)
Neutrophils: 54 %
Platelets: 309 10*3/uL (ref 150–450)
RBC: 4.83 x10E6/uL (ref 3.77–5.28)
RDW: 12.8 % (ref 11.7–15.4)
WBC: 7.9 10*3/uL (ref 3.4–10.8)

## 2020-03-10 LAB — LIPID PANEL
Chol/HDL Ratio: 6.5 ratio — ABNORMAL HIGH (ref 0.0–4.4)
Cholesterol, Total: 364 mg/dL — ABNORMAL HIGH (ref 100–199)
HDL: 56 mg/dL (ref >39)
LDL Chol Calc (NIH): 257 mg/dL — ABNORMAL HIGH (ref 0–99)
Triglycerides: 244 mg/dL — ABNORMAL HIGH (ref 0–149)
VLDL Cholesterol Cal: 51 mg/dL — ABNORMAL HIGH (ref 5–40)

## 2020-03-10 LAB — MICROALBUMIN / CREATININE URINE RATIO
Creatinine, Urine: 31.3 mg/dL
Microalb/Creat Ratio: 27 mg/g creat (ref 0–29)
Microalbumin, Urine: 8.3 ug/mL

## 2020-03-10 LAB — COMPREHENSIVE METABOLIC PANEL
ALT: 25 IU/L (ref 0–32)
AST: 21 IU/L (ref 0–40)
Albumin/Globulin Ratio: 1.8 (ref 1.2–2.2)
Albumin: 4.8 g/dL (ref 3.8–4.8)
Alkaline Phosphatase: 101 IU/L (ref 48–121)
BUN/Creatinine Ratio: 14 (ref 12–28)
BUN: 12 mg/dL (ref 8–27)
Bilirubin Total: 0.6 mg/dL (ref 0.0–1.2)
CO2: 26 mmol/L (ref 20–29)
Calcium: 10 mg/dL (ref 8.7–10.3)
Chloride: 97 mmol/L (ref 96–106)
Creatinine, Ser: 0.87 mg/dL (ref 0.57–1.00)
GFR calc Af Amer: 80 mL/min/{1.73_m2} (ref >59)
GFR calc non Af Amer: 69 mL/min/{1.73_m2} (ref >59)
Globulin, Total: 2.6 g/dL (ref 1.5–4.5)
Glucose: 202 mg/dL — ABNORMAL HIGH (ref 65–99)
Potassium: 4.5 mmol/L (ref 3.5–5.2)
Sodium: 136 mmol/L (ref 134–144)
Total Protein: 7.4 g/dL (ref 6.0–8.5)

## 2020-03-10 LAB — HEMOGLOBIN A1C
Est. average glucose Bld gHb Est-mCnc: 240 mg/dL
Hgb A1c MFr Bld: 10 % — ABNORMAL HIGH (ref 4.8–5.6)

## 2020-03-10 LAB — TSH: TSH: 1.3 u[IU]/mL (ref 0.450–4.500)

## 2020-03-11 NOTE — Progress Notes (Signed)
Called pt with labs. High blood sugar(202) and high A1C(10.0).  Pt has very high cholesterol needs to work on diet and don't eat fast food. Pt verbalized understanding. Pt does not want to try blood sugar medication at this time. She wants to try to fix her diet first and eat better to see if her numbers change before she tries Actos. Pt stated that she wanted to try eating better for about 3 months and then get her labs checked. If labs are still in the high range pt would then want to try Actos.  KP

## 2020-05-18 ENCOUNTER — Ambulatory Visit (INDEPENDENT_AMBULATORY_CARE_PROVIDER_SITE_OTHER): Payer: Medicare Other

## 2020-05-18 DIAGNOSIS — Z0000 Encounter for general adult medical examination without abnormal findings: Secondary | ICD-10-CM | POA: Diagnosis not present

## 2020-05-18 DIAGNOSIS — Z780 Asymptomatic menopausal state: Secondary | ICD-10-CM

## 2020-05-18 DIAGNOSIS — Z1231 Encounter for screening mammogram for malignant neoplasm of breast: Secondary | ICD-10-CM | POA: Diagnosis not present

## 2020-05-18 NOTE — Patient Instructions (Signed)
Valerie Prince , Thank you for taking time to come for your Medicare Wellness Visit. I appreciate your ongoing commitment to your health goals. Please review the following plan we discussed and let me know if I can assist you in the future.   Screening recommendations/referrals: Colonoscopy: done 05/17/16. Repeat in 2022 Mammogram: done 10/30/18. Please call [phone] to schedule your mammogram and bone density screening.  Bone Density: done 01/10/11 Recommended yearly ophthalmology/optometry visit for glaucoma screening and checkup Recommended yearly dental visit for hygiene and checkup  Vaccinations: Influenza vaccine: declined Pneumococcal vaccine: declined Tdap vaccine: due Shingles vaccine: declined   Covid-19: discussed  Advanced directives: Advance directive discussed with you today. I have provided a copy for you to complete at home and have notarized. Once this is complete please bring a copy in to our office so we can scan it into your chart.  Conditions/risks identified: Recommend increasing physical activity   Next appointment: Follow up in one year for your annual wellness visit    Preventive Care 65 Years and Older, Female Preventive care refers to lifestyle choices and visits with your health care provider that can promote health and wellness. What does preventive care include?  A yearly physical exam. This is also called an annual well check.  Dental exams once or twice a year.  Routine eye exams. Ask your health care provider how often you should have your eyes checked.  Personal lifestyle choices, including:  Daily care of your teeth and gums.  Regular physical activity.  Eating a healthy diet.  Avoiding tobacco and drug use.  Limiting alcohol use.  Practicing safe sex.  Taking low-dose aspirin every day.  Taking vitamin and mineral supplements as recommended by your health care provider. What happens during an annual well check? The services and  screenings done by your health care provider during your annual well check will depend on your age, overall health, lifestyle risk factors, and family history of disease. Counseling  Your health care provider may ask you questions about your:  Alcohol use.  Tobacco use.  Drug use.  Emotional well-being.  Home and relationship well-being.  Sexual activity.  Eating habits.  History of falls.  Memory and ability to understand (cognition).  Work and work Statistician.  Reproductive health. Screening  You may have the following tests or measurements:  Height, weight, and BMI.  Blood pressure.  Lipid and cholesterol levels. These may be checked every 5 years, or more frequently if you are over 71 years old.  Skin check.  Lung cancer screening. You may have this screening every year starting at age 33 if you have a 30-pack-year history of smoking and currently smoke or have quit within the past 15 years.  Fecal occult blood test (FOBT) of the stool. You may have this test every year starting at age 11.  Flexible sigmoidoscopy or colonoscopy. You may have a sigmoidoscopy every 5 years or a colonoscopy every 10 years starting at age 20.  Hepatitis C blood test.  Hepatitis B blood test.  Sexually transmitted disease (STD) testing.  Diabetes screening. This is done by checking your blood sugar (glucose) after you have not eaten for a while (fasting). You may have this done every 1-3 years.  Bone density scan. This is done to screen for osteoporosis. You may have this done starting at age 6.  Mammogram. This may be done every 1-2 years. Talk to your health care provider about how often you should have regular mammograms. Talk with  your health care provider about your test results, treatment options, and if necessary, the need for more tests. Vaccines  Your health care provider may recommend certain vaccines, such as:  Influenza vaccine. This is recommended every  year.  Tetanus, diphtheria, and acellular pertussis (Tdap, Td) vaccine. You may need a Td booster every 10 years.  Zoster vaccine. You may need this after age 4.  Pneumococcal 13-valent conjugate (PCV13) vaccine. One dose is recommended after age 94.  Pneumococcal polysaccharide (PPSV23) vaccine. One dose is recommended after age 1. Talk to your health care provider about which screenings and vaccines you need and how often you need them. This information is not intended to replace advice given to you by your health care provider. Make sure you discuss any questions you have with your health care provider. Document Released: 09/02/2015 Document Revised: 04/25/2016 Document Reviewed: 06/07/2015 Elsevier Interactive Patient Education  2017 [employer] Prevention in the Home Falls can cause injuries. They can happen to people of all ages. There are many things you can do to make your home safe and to help prevent falls. What can I do on the outside of my home?  Regularly fix the edges of walkways and driveways and fix any cracks.  Remove anything that might make you trip as you walk through a door, such as a raised step or threshold.  Trim any bushes or trees on the path to your home.  Use bright outdoor lighting.  Clear any walking paths of anything that might make someone trip, such as rocks or tools.  Regularly check to see if handrails are loose or broken. Make sure that both sides of any steps have handrails.  Any raised decks and porches should have guardrails on the edges.  Have any leaves, snow, or ice cleared regularly.  Use sand or salt on walking paths during winter.  Clean up any spills in your garage right away. This includes oil or grease spills. What can I do in the bathroom?  Use night lights.  Install grab bars by the toilet and in the tub and shower. Do not use towel bars as grab bars.  Use non-skid mats or decals in the tub or shower.  If you  need to sit down in the shower, use a plastic, non-slip stool.  Keep the floor dry. Clean up any water that spills on the floor as soon as it happens.  Remove soap buildup in the tub or shower regularly.  Attach bath mats securely with double-sided non-slip rug tape.  Do not have throw rugs and other things on the floor that can make you trip. What can I do in the bedroom?  Use night lights.  Make sure that you have a light by your bed that is easy to reach.  Do not use any sheets or blankets that are too big for your bed. They should not hang down onto the floor.  Have a firm chair that has side arms. You can use this for support while you get dressed.  Do not have throw rugs and other things on the floor that can make you trip. What can I do in the kitchen?  Clean up any spills right away.  Avoid walking on wet floors.  Keep items that you use a lot in easy-to-reach places.  If you need to reach something above you, use a strong step stool that has a grab bar.  Keep electrical cords out of the way.  Do not  use floor polish or wax that makes floors slippery. If you must use wax, use non-skid floor wax.  Do not have throw rugs and other things on the floor that can make you trip. What can I do with my stairs?  Do not leave any items on the stairs.  Make sure that there are handrails on both sides of the stairs and use them. Fix handrails that are broken or loose. Make sure that handrails are as long as the stairways.  Check any carpeting to make sure that it is firmly attached to the stairs. Fix any carpet that is loose or worn.  Avoid having throw rugs at the top or bottom of the stairs. If you do have throw rugs, attach them to the floor with carpet tape.  Make sure that you have a light switch at the top of the stairs and the bottom of the stairs. If you do not have them, ask someone to add them for you. What else can I do to help prevent falls?  Wear shoes  that:  Do not have high heels.  Have rubber bottoms.  Are comfortable and fit you well.  Are closed at the toe. Do not wear sandals.  If you use a stepladder:  Make sure that it is fully opened. Do not climb a closed stepladder.  Make sure that both sides of the stepladder are locked into place.  Ask someone to hold it for you, if possible.  Clearly mark and make sure that you can see:  Any grab bars or handrails.  First and last steps.  Where the edge of each step is.  Use tools that help you move around (mobility aids) if they are needed. These include:  Canes.  Walkers.  Scooters.  Crutches.  Turn on the lights when you go into a dark area. Replace any light bulbs as soon as they burn out.  Set up your furniture so you have a clear path. Avoid moving your furniture around.  If any of your floors are uneven, fix them.  If there are any pets around you, be aware of where they are.  Review your medicines with your doctor. Some medicines can make you feel dizzy. This can increase your chance of falling. Ask your doctor what other things that you can do to help prevent falls. This information is not intended to replace advice given to you by your health care provider. Make sure you discuss any questions you have with your health care provider. Document Released: 06/02/2009 Document Revised: 01/12/2016 Document Reviewed: 09/10/2014 Elsevier Interactive Patient Education  2017 [employer].

## 2020-05-18 NOTE — Progress Notes (Signed)
Subjective:   Valerie Prince is a 69 y.o. female who presents for an Initial Medicare Annual Wellness Visit.  Virtual Visit via Telephone Note  I connected with  Valerie Prince on 05/18/20 at  8:40 AM EDT by telephone and verified that I am speaking with the correct person using two identifiers.  Medicare Annual Wellness visit completed telephonically due to Covid-19 pandemic.   Location: Patient: home Provider: [facility]   I discussed the limitations, risks, security and privacy concerns of performing an evaluation and management service by telephone and the availability of in person appointments. The patient expressed understanding and agreed to proceed.  Unable to perform video visit due to video visit attempted and failed and/or patient does not have video capability.   Some vital signs may be absent or patient reported.   Clemetine Marker, LPN    Review of Systems     Cardiac Risk Factors include: advanced age (>62men, >61 women);diabetes mellitus     Objective:    There were no vitals filed for this visit. There is no height or weight on file to calculate BMI.  Advanced Directives 05/18/2020 05/17/2016 04/13/2016  Does Patient Have a Medical Advance Directive? No No No  Would patient like information on creating a medical advance directive? Yes (MAU/Ambulatory/Procedural Areas - Information given) - No - patient declined information    Current Medications (verified) Outpatient Encounter Medications as of 05/18/2020  Medication Sig  . BIOTIN PO Take by mouth. Twice a week. 1000mg  tablet  . cholecalciferol (VITAMIN D) 1000 units tablet Take 125 Units by mouth daily.  [name] omega-3 fish oil (MAXEPA) 1000 MG CAPS capsule Take by mouth.  . vitamin B-12 (CYANOCOBALAMIN) 1000 MCG tablet Take 1,000 mcg by mouth daily.   No facility-administered encounter medications on file as of 05/18/2020.    Allergies (verified) Codeine   History: Past Medical History:  Diagnosis Date  .  Benign neoplasm of cecum   . Benign neoplasm of sigmoid colon   . Diabetes mellitus without complication ([place])    pt denies  . Hyperlipidemia   . Hypertension    pt denies  . Pinched nerve in neck   . Right leg pain    intermittent  . Special screening for malignant neoplasms, colon   . Wears dentures    upper partial   Past Surgical History:  Procedure Laterality Date  . ABDOMINAL HYSTERECTOMY    . COLONOSCOPY WITH PROPOFOL N/A 05/17/2016   Procedure: COLONOSCOPY WITH PROPOFOL;  Surgeon: [name], MD;  Location: [place];  Service: Endoscopy;  Laterality: N/A;  PLEASE KEEP PT ARRIVAL TIME 10 OR AFTER  . POLYPECTOMY  05/17/2016   Procedure: POLYPECTOMY;  Surgeon: [name], MD;  Location: [place];  Service: Endoscopy;;  . ROTATOR CUFF REPAIR Left 2012   Family History  Problem Relation Age of Onset  . Diabetes Sister   . Stroke Father   . Hypertension Sister    Social History   Socioeconomic History  . Marital status: Single    Spouse name: Not on file  . Number of children: 0  . Years of education: Not on file  . Highest education level: Not on file  Occupational History  . Not on file  Tobacco Use  . Smoking status: Former Smoker    Packs/day: 0.50    Years: 32.00    Pack years: 16.00    Types: Cigarettes    Quit date: 03/20/2005    Years since  quitting: 15.1  . Smokeless tobacco: Never Used  Vaping Use  . Vaping Use: Never used  Substance and Sexual Activity  . Alcohol use: Yes    Comment: 3 x year - wine  . Drug use: Never  . Sexual activity: Not Currently  Other Topics Concern  . Not on file  Social History Narrative  . Not on file   Social Determinants of Health   Financial Resource Strain: Low Risk   . Difficulty of Paying Living Expenses: Not hard at all  Food Insecurity: No Food Insecurity  . Worried About Charity fundraiser in the Last Year: Never true  . Ran Out of Food in the Last Year: Never true  Transportation  Needs: No Transportation Needs  . Lack of Transportation (Medical): No  . Lack of Transportation (Non-Medical): No  Physical Activity: Inactive  . Days of Exercise per Week: 0 days  . Minutes of Exercise per Session: 0 min  Stress: No Stress Concern Present  . Feeling of Stress : Not at all  Social Connections: Unknown  . Frequency of Communication with Friends and Family: Patient refused  . Frequency of Social Gatherings with Friends and Family: Patient refused  . Attends Religious Services: Patient refused  . Active Member of Clubs or Organizations: Patient refused  . Attends Archivist Meetings: Patient refused  . Marital Status: Never married    Tobacco Counseling Counseling given: Not Answered   Clinical Intake:  Pre-visit preparation completed: Yes  Pain : No/denies pain     Nutritional Risks: None Diabetes: Yes CBG done?: No Did pt. bring in CBG monitor from home?: No  How often do you need to have someone help you when you read instructions, pamphlets, or other written materials from your doctor or pharmacy?: 1 - Never  Nutrition Risk Assessment:  Has the patient had any N/V/D within the last 2 months?  No  Does the patient have any non-healing wounds?  No  Has the patient had any unintentional weight loss or weight gain?  No   Diabetes:  Is the patient diabetic?  Yes  If diabetic, was a CBG obtained today?  No  Did the patient bring in their glucometer from home?  No  How often do you monitor your CBG's? 1-2 times per month.   Financial Strains and Diabetes Management:  Are you having any financial strains with the device, your supplies or your medication? No .  Does the patient want to be seen by Chronic Care Management for management of their diabetes?  No  Would the patient like to be referred to a Nutritionist or for Diabetic Management?  No   Diabetic Exams:  Diabetic Eye Exam: Completed 02/19/20 negative retinopathy.   Diabetic Foot  Exam: Completed 03/09/20.   Interpreter Needed?: No  Information entered by :: Clemetine Marker LPN   Activities of Daily Living In your present state of health, do you have any difficulty performing the following activities: 05/18/2020 03/09/2020  Hearing? N N  Comment declines hearing aids -  Vision? N N  Difficulty concentrating or making decisions? N N  Walking or climbing stairs? N N  Dressing or bathing? N N  Doing errands, shopping? N N  Preparing Food and eating ? N -  Using the Toilet? N -  In the past six months, have you accidently leaked urine? N -  Do you have problems with loss of bowel control? N -  Managing your Medications? N -  Managing your Finances? N -  Housekeeping or managing your Housekeeping? N -  Some recent data might be hidden    Patient Care Team: Glean Hess, MD as PCP - General (Internal Medicine) Leandrew Koyanagi, MD as Referring Physician (Ophthalmology)  Indicate any recent Medical Services you may have received from other than [facility] providers in the past year (date may be approximate).     Assessment:   This is a routine wellness examination for Valerie Prince.  Hearing/Vision screen  Hearing Screening   125Hz  250Hz  500Hz  1000Hz  2000Hz  3000Hz  4000Hz  6000Hz  8000Hz   Right ear:           Left ear:           Comments: Pt denies hearing difficulty  Vision Screening Comments: [facility] screenings done [facility]  Dietary issues and exercise activities discussed: Current Exercise Habits: The patient does not participate in regular exercise at present, Exercise limited by: None identified  Goals    .  "I don't want to take medications" (pt-stated)      Current Barriers:  . Uncontrolled T2DM, last A1c 11.5%; though dislikes the label of "diabetes" . Extreme distrust of healthcare system and traditional evidence-based medicine, from a long history of being given medications without adequate explanation, and not seeing significant  benefit and having side effects. She believes hat being prescribed metformin years ago contributed to worsening sugar control. . Denies typical symptoms of hyperglycemia: polyuria, polydipsia, polyphagia, nocturia, peripheral neuropathy (endorses having sensation in feet), retinopathy (though notes blurred vision, but believes it was related to eye drops given to her by ophthalmology); [name] Chose not to take Jardiance; notes the $45 copay would have been manageable, but she has always felt that medications make her feel worse . Reports that she checks BG at home, and reports fastings ~130s and post prandials ~180s. Notes that a "diabetes doctor" at one time recommended blood sugars around 150 be her goal. Glucose on last BMP here was 216 . Reports continuing to be active - cares for her home and yard, uses a push mower, walks often . Focuses on reduced carbohydrate intake - boiled eggs + coffee w/ artificial sweetener for breakfast, often baked/broiled foods and vegetables with small starch servings  Pharmacist Clinical Goal(s):  [name] Over the next 90 days, patient will work with PharmD and primary care provider to address needs related to patient-centered optimized health maintenance   Interventions: . Intensive discussion regarding micro and macrovascular complications of uncontrolled blood sugars. Discussed progressive nature of beta cell decline in diabetes, even with optimized lifestyle choices. Patient promises that if she develops any symptoms I described, she will contact me or the office to discuss options for treatment o Patient notes that she accepts full personal responsibility for any negative health consequences of declining pharmacotherapy  o Refuses metformin. I am concerned that genitourinary symptoms from Jardiance initiation may be interpreted as negative outcomes by the patient if she was to start Jardiance, even if this agent may have the strongest recommendation for use with her CKD. If  she ever is amenable to agents, recommend focused patient centered risk vs benefit discussion of options; may consider DPP4 vs low dose pioglitazone for lowest incidence of side effects . Discussed that abrupt lowering of blood sugars can cause symptoms, which may explain why she felt poorly after medications previously . Praised for her intense focus on diet/lifestyle modifications to control blood sugar . Discussed that older glucometers may not measure glucose as  accurately. Patient is unsure how long she has had her current meter, but believes ~1 year.  . Discussed goal fasting is <130 and goal post prandial <180, which is where "average" goal of <150 came from. She notes that she will continue to monitor .   Patient Self Care Activities:  . Calls provider office for new concerns or questions  Initial goal documentation       Depression Screen PHQ 2/9 Scores 05/18/2020 03/09/2020 06/18/2018 04/13/2016  PHQ - 2 Score 0 0 0 0  PHQ- 9 Score - 0 0 -    Fall Risk Fall Risk  05/18/2020 03/09/2020 06/18/2018 05/14/2016 04/13/2016  Falls in the past year? 0 0 No Yes Yes  Comment - - - no falls since 04/06/16 -- tripped over toy -  Number falls in past yr: 0 - - - 1  Injury with Fall? 0 - - - No  Risk for fall due to : No Fall Risks No Fall Risks - - -  Follow up Falls prevention discussed Falls evaluation completed - - Falls prevention discussed    Any stairs in or around the home? Yes  If so, are there any without handrails? No  Home free of loose throw rugs in walkways, pet beds, electrical cords, etc? Yes  Adequate lighting in your home to reduce risk of falls? Yes   ASSISTIVE DEVICES UTILIZED TO PREVENT FALLS:  Life alert? No  Use of a cane, walker or w/c? Yes  Grab bars in the bathroom? Yes  Shower chair or bench in shower? No  Elevated toilet seat or a handicapped toilet? No   TIMED UP AND GO:  Was the test performed? No . Telephonic visit  Cognitive Function: pt declines  6CIT for 2021 AWV; states no memory issues        Immunizations  There is no immunization history on file for this patient.  TDAP status: Due, Education has been provided regarding the importance of this vaccine. Advised may receive this vaccine at local pharmacy or Health Dept. Aware to provide a copy of the vaccination record if obtained from local pharmacy or Health Dept. Verbalized acceptance and understanding.   Flu Vaccine status: Declined, Education has been provided regarding the importance of this vaccine but patient still declined. Advised may receive this vaccine at local pharmacy or Health Dept. Aware to provide a copy of the vaccination record if obtained from local pharmacy or Health Dept. Verbalized acceptance and understanding.   Pneumococcal vaccine status: Declined,  Education has been provided regarding the importance of this vaccine but patient still declined. Advised may receive this vaccine at local pharmacy or Health Dept. Aware to provide a copy of the vaccination record if obtained from local pharmacy or Health Dept. Verbalized acceptance and understanding.    Covid-19 vaccine status: Declined, Education has been provided regarding the importance of this vaccine but patient still declined. Advised may receive this vaccine at local pharmacy or Health Dept.or vaccine clinic. Aware to provide a copy of the vaccination record if obtained from local pharmacy or Health Dept. Verbalized acceptance and understanding.  Qualifies for Shingles Vaccine? Yes   Zostavax completed No   Shingrix Completed?: No.    Education has been provided regarding the importance of this vaccine. Patient has been advised to call insurance company to determine out of pocket expense if they have not yet received this vaccine. Advised may also receive vaccine at local pharmacy or Health Dept. Verbalized acceptance and understanding.  Screening  Tests Health Maintenance  Topic Date Due  . COVID-19  Vaccine (1) Never done  . PNA vac Low Risk Adult (1 of 2 - PCV13) Never done  . INFLUENZA VACCINE  Never done  . TETANUS/TDAP  03/09/2021 (Originally 11/11/1969)  . HEMOGLOBIN A1C  09/09/2020  . MAMMOGRAM  10/29/2020  . OPHTHALMOLOGY EXAM  02/18/2021  . FOOT EXAM  03/09/2021  . URINE MICROALBUMIN  03/09/2021  . COLONOSCOPY  05/17/2021  . DEXA SCAN  Completed  . Hepatitis C Screening  Completed    Health Maintenance  Health Maintenance Due  Topic Date Due  . COVID-19 Vaccine (1) Never done  . PNA vac Low Risk Adult (1 of 2 - PCV13) Never done  . INFLUENZA VACCINE  Never done    Colorectal cancer screening: Completed 05/17/16. Repeat every 5 years   Mammogram status: Completed 10/30/18. Repeat every year ordered today   Bone Density status: Completed 01/10/11. Results reflect: Bone density results: NORMAL. Repeat every 2 years. ordered today  Lung Cancer Screening: (Low Dose CT Chest recommended if Age 9-80 years, 30 pack-year currently smoking OR have quit w/in 15years.) does not qualify.   Additional Screening:  Hepatitis C Screening: does qualify; Completed 07/21/18   Vision Screening: Recommended annual ophthalmology exams for early detection of glaucoma and other disorders of the eye. Is the patient up to date with their annual eye exam?  Yes  Who is the provider or what is the name of the office in which the patient attends annual eye exams? [place] Screening: Recommended annual dental exams for proper oral hygiene  Community Resource Referral / Chronic Care Management: CRR required this visit?  No   CCM required this visit?  No      Plan:     I have personally reviewed and noted the following in the patient's chart:   . Medical and social history . Use of alcohol, tobacco or illicit drugs  . Current medications and supplements . Functional ability and status . Nutritional status . Physical activity . Advanced directives . List of other  physicians . Hospitalizations, surgeries, and ER visits in previous 12 months . Vitals . Screenings to include cognitive, depression, and falls . Referrals and appointments  In addition, I have reviewed and discussed with patient certain preventive protocols, quality metrics, and best practice recommendations. A written personalized care plan for preventive services as well as general preventive health recommendations were provided to patient.     Clemetine Marker, LPN   [id number]   Nurse Notes: none

## 2020-05-24 ENCOUNTER — Telehealth: Payer: Self-pay

## 2020-05-24 NOTE — Telephone Encounter (Signed)
Copied from [place] [id number]. Topic: General - Other >> May 24, 2020 10:01 AM Antonieta Iba C wrote: Reason for CRM: pt called in for assistance. Pt says that she just received a call stating that she is scheduled for mammogram in November, pt says that she had /get her mammograms in March, pt would like to cancel apt as she doesn't need it, pt is unsure of why she was scheduled for November.    Please assist.

## 2020-05-31 NOTE — Telephone Encounter (Signed)
Noted  KP 

## 2020-05-31 NOTE — Telephone Encounter (Signed)
Pt is calling to let nurse know she does not have mammogram every year but every 2 years. Pt will sch her next mammogram in march 2022

## 2020-06-23 ENCOUNTER — Other Ambulatory Visit: Payer: Medicare Other

## 2020-08-08 ENCOUNTER — Telehealth: Payer: Self-pay | Admitting: *Deleted

## 2020-08-08 NOTE — Chronic Care Management (AMB) (Signed)
  Chronic Care Management   Note  08/08/2020 Name: Valerie Prince MRN: [id number] DOB: [birth year]  Valerie Prince is a 69 y.o. year old female who is a primary care patient of Glean Hess, MD. I reached out to [name] by phone today in response to a referral sent by [name]'s health plan.     Ms. Strain was given information about Chronic Care Management services today including:  1. CCM service includes personalized support from designated clinical staff supervised by her physician, including individualized plan of care and coordination with other care providers 2. 24/7 contact phone numbers for assistance for urgent and routine care needs. 3. Service will only be billed when office clinical staff spend 20 minutes or more in a month to coordinate care. 4. Only one practitioner may furnish and bill the service in a calendar month. 5. The patient may stop CCM services at any time (effective at the end of the month) by phone call to the office staff. 6. The patient will be responsible for cost sharing (co-pay) of up to 20% of the service fee (after annual deductible is met).  Patient did not agree to enrollment in care management services and does not wish to consider at this time.  Follow up plan: Patient declines engagement by the care management team. Appropriate care team members and provider have been notified via electronic communication. The care management team is available to follow up with the patient after provider conversation with the patient regarding recommendation for care management engagement and subsequent re-referral to the care management team.   [facility]  Direct Dial: [phone]

## 2020-09-09 ENCOUNTER — Ambulatory Visit: Payer: Medicare Other | Admitting: Internal Medicine

## 2020-09-30 ENCOUNTER — Ambulatory Visit (INDEPENDENT_AMBULATORY_CARE_PROVIDER_SITE_OTHER): Payer: Medicare Other | Admitting: Internal Medicine

## 2020-09-30 ENCOUNTER — Encounter: Payer: Self-pay | Admitting: Internal Medicine

## 2020-09-30 ENCOUNTER — Other Ambulatory Visit: Payer: Self-pay

## 2020-09-30 VITALS — BP 148/78 | HR 86 | Temp 98.1°F | Ht 63.1 in | Wt 170.0 lb

## 2020-09-30 DIAGNOSIS — L600 Ingrowing nail: Secondary | ICD-10-CM | POA: Diagnosis not present

## 2020-09-30 DIAGNOSIS — I10 Essential (primary) hypertension: Secondary | ICD-10-CM | POA: Diagnosis not present

## 2020-09-30 DIAGNOSIS — E1165 Type 2 diabetes mellitus with hyperglycemia: Secondary | ICD-10-CM

## 2020-09-30 MED ORDER — BLOOD GLUCOSE MONITOR KIT: PACK | 0 refills | Status: AC

## 2020-09-30 NOTE — Progress Notes (Signed)
Date:  09/30/2020   Name:  Valerie Prince   DOB:  08/28/1950   MRN:  [id number]   Chief Complaint: Hypertension  Hypertension This is a chronic problem. The problem is uncontrolled. Associated symptoms include blurred vision. Pertinent negatives include no chest pain, headaches, palpitations, peripheral edema or shortness of breath. There are no associated agents to hypertension. Risk factors for coronary artery disease include diabetes mellitus. Past treatments include nothing (metformin - pt states severe side effects).  Diabetes She presents for her follow-up diabetic visit. She has type 2 diabetes mellitus. Pertinent negatives for hypoglycemia include no dizziness, headaches or nervousness/anxiousness. Associated symptoms include blurred vision. Pertinent negatives for diabetes include no chest pain, no fatigue, no foot ulcerations, no polydipsia, no polyuria, no visual change, no weakness and no weight loss. Symptoms are stable. Current diabetic treatment includes diet. She is compliant with treatment most of the time.    Lab Results  Component Value Date   CREATININE 0.87 03/09/2020   BUN 12 03/09/2020   NA 136 03/09/2020   K 4.5 03/09/2020   CL 97 03/09/2020   CO2 26 03/09/2020   Lab Results  Component Value Date   CHOL 364 (H) 03/09/2020   HDL 56 03/09/2020   LDLCALC 257 (H) 03/09/2020   TRIG 244 (H) 03/09/2020   CHOLHDL 6.5 (H) 03/09/2020   Lab Results  Component Value Date   TSH 1.300 03/09/2020   Lab Results  Component Value Date   HGBA1C 10.0 (H) 03/09/2020   Lab Results  Component Value Date   WBC 7.9 03/09/2020   HGB 14.4 03/09/2020   HCT 41.8 03/09/2020   MCV 87 03/09/2020   PLT 309 03/09/2020   Lab Results  Component Value Date   ALT 25 03/09/2020   AST 21 03/09/2020   ALKPHOS 101 03/09/2020   BILITOT 0.6 03/09/2020     Review of Systems  Constitutional: Negative for chills, fatigue, fever, unexpected weight change and weight loss.  Eyes:  Positive for blurred vision and visual disturbance (in left eye ?cataract).  Respiratory: Negative for chest tightness, shortness of breath and wheezing.   Cardiovascular: Negative for chest pain and palpitations.  Gastrointestinal: Negative for abdominal pain.  Endocrine: Negative for polydipsia and polyuria.  Genitourinary: Negative for dysuria, frequency and urgency.  Musculoskeletal: Negative for arthralgias, joint swelling and myalgias.  Skin: Positive for wound.  Allergic/Immunologic: Negative for environmental allergies.  Neurological: Negative for dizziness, weakness, light-headedness, numbness and headaches.  Psychiatric/Behavioral: Negative for sleep disturbance. The patient is not nervous/anxious.     Patient Active Problem List   Diagnosis Date Noted  . Lumbar spondylosis 03/09/2020  . Carpal tunnel syndrome 03/08/2020  . Cervical radiculopathy 03/08/2020  . Edema 03/08/2020  . Localized, primary osteoarthritis of shoulder region 03/08/2020  . HTN (hypertension) 06/18/2018  . Hyperlipidemia associated with type 2 diabetes mellitus ([place]) 06/18/2018  . Non-compliance 06/18/2018  . Uncontrolled type 2 diabetes mellitus with hyperglycemia ([place]) 06/17/2018    Allergies  Allergen Reactions  . Codeine Nausea And Vomiting    Past Surgical History:  Procedure Laterality Date  . ABDOMINAL HYSTERECTOMY    . COLONOSCOPY WITH PROPOFOL N/A 05/17/2016   Procedure: COLONOSCOPY WITH PROPOFOL;  Surgeon: [name], MD;  Location: [place];  Service: Endoscopy;  Laterality: N/A;  PLEASE KEEP PT ARRIVAL TIME 10 OR AFTER  . POLYPECTOMY  05/17/2016   Procedure: POLYPECTOMY;  Surgeon: [name], MD;  Location: [place];  Service: Endoscopy;;  .  ROTATOR CUFF REPAIR Left 2012    Social History   Tobacco Use  . Smoking status: Former Smoker    Packs/day: 0.50    Years: 32.00    Pack years: 16.00    Types: Cigarettes    Quit date: 03/20/2005    Years since  quitting: 15.5  . Smokeless tobacco: Never Used  Vaping Use  . Vaping Use: Never used  Substance Use Topics  . Alcohol use: Yes    Comment: 3 x year - wine  . Drug use: Never     Medication list has been reviewed and updated.  Current Meds  Medication Sig  . blood glucose meter kit and supplies KIT Dispense based on patient and insurance preference. Test BS twice a day.  . cholecalciferol (VITAMIN D) 1000 units tablet Take 125 Units by mouth daily.  . Flaxseed, Linseed, (FLAXSEED OIL PO) Take by mouth.  . omega-3 fish oil (MAXEPA) 1000 MG CAPS capsule Take by mouth.  . psyllium (METAMUCIL) 58.6 % powder Take 1 packet by mouth 3 (three) times daily.  . vitamin B-12 (CYANOCOBALAMIN) 1000 MCG tablet Take 1,000 mcg by mouth daily.    PHQ 2/9 Scores 09/30/2020 05/18/2020 03/09/2020 06/18/2018  PHQ - 2 Score 0 0 0 0  PHQ- 9 Score 0 - 0 0    GAD 7 : Generalized Anxiety Score 09/30/2020 03/09/2020  Nervous, Anxious, on Edge 0 0  Control/stop worrying 0 0  Worry too much - different things 0 0  Trouble relaxing 0 0  Restless 0 0  Easily annoyed or irritable 0 0  Afraid - awful might happen 0 0  Total GAD 7 Score 0 0  Anxiety Difficulty - Not difficult at all    BP Readings from Last 3 Encounters:  09/30/20 (!) 148/78  03/09/20 139/89  02/03/19 (!) 160/96    Physical Exam Vitals and nursing note reviewed.  Constitutional:      General: She is not in acute distress.    Appearance: Normal appearance. She is well-developed.  HENT:     Head: Normocephalic and atraumatic.  Neck:     Vascular: No carotid bruit.  Cardiovascular:     Rate and Rhythm: Normal rate and regular rhythm.     Pulses: Normal pulses.     Heart sounds: No murmur heard.   Pulmonary:     Effort: Pulmonary effort is normal. No respiratory distress.     Breath sounds: No wheezing or rhonchi.  Musculoskeletal:        General: Normal range of motion.     Cervical back: Normal range of motion.     Right  lower leg: No edema.     Left lower leg: No edema.       Feet:  Feet:     Comments: Thick nail with overgrown skin/callus Lymphadenopathy:     Cervical: No cervical adenopathy.  Skin:    General: Skin is warm and dry.     Findings: No rash.  Neurological:     General: No focal deficit present.     Mental Status: She is alert and oriented to person, place, and time.  Psychiatric:        Mood and Affect: Mood and affect and mood normal.        Behavior: Behavior normal.     Wt Readings from Last 3 Encounters:  09/30/20 170 lb (77.1 kg)  03/09/20 170 lb (77.1 kg)  02/03/19 172 lb (78 kg)    BP [name])  148/78   Pulse 86   Temp 98.1 F (36.7 C) (Oral)   Ht 5' 3.1" (1.603 m)   Wt 170 lb (77.1 kg)   SpO2 98%   BMI 30.02 kg/m   Assessment and Plan: 1. Primary hypertension Patient claims normal BP at home Will bring cuff to next appointment Pt to continue current regimen and low sodium diet; benefits of regular exercise as able discussed. - Basic metabolic panel  2. Uncontrolled type 2 diabetes mellitus with hyperglycemia (HCC) High A1C - pt continues to refuse medication but might consider Actos Eye exam up to date - Hemoglobin A5B - Basic metabolic panel - blood glucose meter kit and supplies KIT; Dispense based on patient and insurance preference. Test BS twice a day.  Dispense: 1 each; Refill: 0  3. Ingrown toenail of right foot Recommend podiatry evaluation - Ambulatory referral to Podiatry   Partially dictated using Dragon software. Any errors are unintentional.  Halina Maidens, MD [facility]  09/30/2020

## 2020-10-01 LAB — BASIC METABOLIC PANEL
BUN/Creatinine Ratio: 12 (ref 12–28)
BUN: 11 mg/dL (ref 8–27)
CO2: 21 mmol/L (ref 20–29)
Calcium: 9.9 mg/dL (ref 8.7–10.3)
Chloride: 100 mmol/L (ref 96–106)
Creatinine, Ser: 0.93 mg/dL (ref 0.57–1.00)
GFR calc Af Amer: 74 mL/min/{1.73_m2} (ref >59)
GFR calc non Af Amer: 64 mL/min/{1.73_m2} (ref >59)
Glucose: 241 mg/dL — ABNORMAL HIGH (ref 65–99)
Potassium: 4.3 mmol/L (ref 3.5–5.2)
Sodium: 139 mmol/L (ref 134–144)

## 2020-10-01 LAB — HEMOGLOBIN A1C
Est. average glucose Bld gHb Est-mCnc: 255 mg/dL
Hgb A1c MFr Bld: 10.5 % — ABNORMAL HIGH (ref 4.8–5.6)

## 2020-10-02 ENCOUNTER — Other Ambulatory Visit: Payer: Self-pay | Admitting: Internal Medicine

## 2020-10-02 DIAGNOSIS — E1165 Type 2 diabetes mellitus with hyperglycemia: Secondary | ICD-10-CM

## 2020-10-02 MED ORDER — PIOGLITAZONE HCL 15 MG PO TABS: 15.0000 mg | ORAL_TABLET | Freq: Every day | ORAL | 0 refills | Status: AC

## 2020-10-10 DIAGNOSIS — B351 Tinea unguium: Secondary | ICD-10-CM | POA: Diagnosis not present

## 2020-10-10 DIAGNOSIS — L03031 Cellulitis of right toe: Secondary | ICD-10-CM | POA: Diagnosis not present

## 2020-10-10 DIAGNOSIS — E119 Type 2 diabetes mellitus without complications: Secondary | ICD-10-CM | POA: Diagnosis not present

## 2020-10-24 DIAGNOSIS — E119 Type 2 diabetes mellitus without complications: Secondary | ICD-10-CM | POA: Diagnosis not present

## 2020-10-24 DIAGNOSIS — L03031 Cellulitis of right toe: Secondary | ICD-10-CM | POA: Diagnosis not present

## 2020-10-31 ENCOUNTER — Ambulatory Visit
Admission: RE | Admit: 2020-10-31 | Discharge: 2020-10-31 | Disposition: A | Discharge status: Home / Self Care | Payer: Medicare Other | Source: Ambulatory Visit | Attending: Internal Medicine | Admitting: Internal Medicine

## 2020-10-31 ENCOUNTER — Other Ambulatory Visit: Payer: Self-pay

## 2020-10-31 DIAGNOSIS — Z1231 Encounter for screening mammogram for malignant neoplasm of breast: Secondary | ICD-10-CM

## 2020-10-31 DIAGNOSIS — Z780 Asymptomatic menopausal state: Secondary | ICD-10-CM | POA: Diagnosis not present

## 2021-01-04 ENCOUNTER — Telehealth: Payer: Self-pay

## 2021-01-04 NOTE — Telephone Encounter (Signed)
Called patient and left VM letting her know she is scheduled on 06/14th afternoon and we need to schedule her a different date due to Dr. Army Melia having to be out of the office. Waiting call back so we can change patients appointment.

## 2021-01-31 ENCOUNTER — Ambulatory Visit: Payer: Medicare Other | Admitting: Internal Medicine

## 2021-03-14 DIAGNOSIS — H2589 Other age-related cataract: Secondary | ICD-10-CM | POA: Diagnosis not present

## 2021-03-14 LAB — HM DIABETES EYE EXAM

## 2021-03-20 DIAGNOSIS — H2589 Other age-related cataract: Secondary | ICD-10-CM | POA: Diagnosis not present

## 2021-03-21 ENCOUNTER — Encounter: Payer: Self-pay | Admitting: Internal Medicine

## 2021-03-30 ENCOUNTER — Encounter: Payer: Self-pay | Admitting: Ophthalmology

## 2021-04-03 NOTE — Discharge Instructions (Signed)

## 2021-04-05 ENCOUNTER — Ambulatory Visit
Admission: RE | Admit: 2021-04-05 | Discharge: 2021-04-05 | Disposition: A | Discharge status: Home / Self Care | Payer: Medicare Other | Attending: Ophthalmology | Admitting: Ophthalmology

## 2021-04-05 ENCOUNTER — Encounter: Payer: Self-pay | Admitting: Ophthalmology

## 2021-04-05 ENCOUNTER — Ambulatory Visit: Payer: Medicare Other | Admitting: Anesthesiology

## 2021-04-05 ENCOUNTER — Encounter
Admission: RE | Disposition: A | Discharge status: Home / Self Care | Payer: Self-pay | Source: Home / Self Care | Attending: Ophthalmology

## 2021-04-05 ENCOUNTER — Other Ambulatory Visit: Payer: Self-pay

## 2021-04-05 DIAGNOSIS — Y838 Other surgical procedures as the cause of abnormal reaction of the patient, or of later complication, without mention of misadventure at the time of the procedure: Secondary | ICD-10-CM | POA: Insufficient documentation

## 2021-04-05 DIAGNOSIS — Z79899 Other long term (current) drug therapy: Secondary | ICD-10-CM | POA: Diagnosis not present

## 2021-04-05 DIAGNOSIS — H25812 Combined forms of age-related cataract, left eye: Secondary | ICD-10-CM | POA: Diagnosis not present

## 2021-04-05 DIAGNOSIS — H268 Other specified cataract: Secondary | ICD-10-CM | POA: Diagnosis not present

## 2021-04-05 DIAGNOSIS — Z7984 Long term (current) use of oral hypoglycemic drugs: Secondary | ICD-10-CM | POA: Insufficient documentation

## 2021-04-05 DIAGNOSIS — H5988 Other intraoperative complications of eye and adnexa, not elsewhere classified: Secondary | ICD-10-CM | POA: Insufficient documentation

## 2021-04-05 DIAGNOSIS — H18332 Rupture in Descemet's membrane, left eye: Secondary | ICD-10-CM | POA: Insufficient documentation

## 2021-04-05 DIAGNOSIS — Z885 Allergy status to narcotic agent status: Secondary | ICD-10-CM | POA: Insufficient documentation

## 2021-04-05 DIAGNOSIS — Z87891 Personal history of nicotine dependence: Secondary | ICD-10-CM | POA: Diagnosis not present

## 2021-04-05 DIAGNOSIS — H2589 Other age-related cataract: Secondary | ICD-10-CM | POA: Diagnosis not present

## 2021-04-05 HISTORY — PX: CATARACT EXTRACTION W/PHACO: SHX586

## 2021-04-05 LAB — GLUCOSE, CAPILLARY
Glucose-Capillary: 228 mg/dL — ABNORMAL HIGH (ref 70–99)
Glucose-Capillary: 251 mg/dL — ABNORMAL HIGH (ref 70–99)

## 2021-04-05 SURGERY — PHACOEMULSIFICATION, CATARACT, WITH IOL INSERTION
Anesthesia: Monitor Anesthesia Care | Site: Eye | Laterality: Left

## 2021-04-05 MED ORDER — PHENYLEPHRINE HCL 10 % OP SOLN
1.0000 [drp] | OPHTHALMIC | Status: DC | PRN
  Administered 2021-04-05 (×3): 1 [drp] via OPHTHALMIC

## 2021-04-05 MED ORDER — SIGHTPATH DOSE#1 BSS IO SOLN
INTRAOCULAR | Status: DC | PRN
  Administered 2021-04-05: 2 mL

## 2021-04-05 MED ORDER — BRIMONIDINE TARTRATE-TIMOLOL 0.2-0.5 % OP SOLN
OPHTHALMIC | Status: DC | PRN
  Administered 2021-04-05: 1 [drp] via OPHTHALMIC

## 2021-04-05 MED ORDER — TRYPAN BLUE 0.06 % OP SOLN
OPHTHALMIC | Status: DC | PRN
  Administered 2021-04-05: 0.5 mL via INTRAOCULAR

## 2021-04-05 MED ORDER — SIGHTPATH DOSE#1 BSS IO SOLN
INTRAOCULAR | Status: DC | PRN
  Administered 2021-04-05: 15 mL via INTRAOCULAR

## 2021-04-05 MED ORDER — SIGHTPATH DOSE#1 BSS IO SOLN
INTRAOCULAR | Status: DC | PRN
  Administered 2021-04-05: 121 mL via OPHTHALMIC

## 2021-04-05 MED ORDER — MIDAZOLAM HCL 2 MG/2ML IJ SOLN
INTRAMUSCULAR | Status: DC | PRN
  Administered 2021-04-05: 2 mg via INTRAVENOUS

## 2021-04-05 MED ORDER — FENTANYL CITRATE (PF) 100 MCG/2ML IJ SOLN
INTRAMUSCULAR | Status: DC | PRN
  Administered 2021-04-05: 50 ug via INTRAVENOUS

## 2021-04-05 MED ORDER — CYCLOPENTOLATE HCL 2 % OP SOLN
1.0000 [drp] | OPHTHALMIC | Status: DC | PRN
  Administered 2021-04-05 (×3): 1 [drp] via OPHTHALMIC

## 2021-04-05 MED ORDER — ACETAMINOPHEN 325 MG PO TABS: 325.0000 mg | ORAL_TABLET | Freq: Once | ORAL | Status: DC

## 2021-04-05 MED ORDER — SIGHTPATH DOSE#1 SODIUM HYALURONATE 23 MG/ML IO SOLUTION
PREFILLED_SYRINGE | INTRAOCULAR | Status: DC | PRN
  Administered 2021-04-05: 0.55 mL via INTRAOCULAR

## 2021-04-05 MED ORDER — TETRACAINE HCL 0.5 % OP SOLN
1.0000 [drp] | OPHTHALMIC | Status: DC | PRN
  Administered 2021-04-05 (×3): 1 [drp] via OPHTHALMIC

## 2021-04-05 MED ORDER — CEFUROXIME OPHTHALMIC INJECTION 1 MG/0.1 ML
INJECTION | OPHTHALMIC | Status: DC | PRN
  Administered 2021-04-05: 0.1 mL via INTRACAMERAL

## 2021-04-05 MED ORDER — LACTATED RINGERS IV SOLN: INTRAVENOUS | Status: DC

## 2021-04-05 MED ORDER — ACETAMINOPHEN 160 MG/5ML PO SOLN: 325.0000 mg | Freq: Once | ORAL | Status: DC

## 2021-04-05 MED ORDER — SIGHTPATH DOSE#1 NA HYALUR & NA CHOND-NA HYALUR IO KIT
PACK | INTRAOCULAR | Status: DC | PRN
  Administered 2021-04-05: 1 via OPHTHALMIC

## 2021-04-05 SURGICAL SUPPLY — 16 items
CANNULA ANT/CHMB 27GA (MISCELLANEOUS) ×2 IMPLANT
GLOVE SRG 8 PF TXTR STRL LF DI (GLOVE) ×1 IMPLANT
GLOVE SURG ENC TEXT LTX SZ7.5 (GLOVE) ×2 IMPLANT
GLOVE SURG UNDER POLY LF SZ8 (GLOVE) ×2
GOWN STRL REUS W/ TWL LRG LVL3 (GOWN DISPOSABLE) ×2 IMPLANT
GOWN STRL REUS W/TWL LRG LVL3 (GOWN DISPOSABLE) ×4
LENS IOL TECNIS EYHANCE 22.5 (Intraocular Lens) ×2 IMPLANT
MARKER SKIN DUAL TIP RULER LAB (MISCELLANEOUS) ×2 IMPLANT
NEEDLE CAPSULORHEX 25GA (NEEDLE) ×2 IMPLANT
NEEDLE FILTER BLUNT 18X 1/2SAF (NEEDLE) ×2
NEEDLE FILTER BLUNT 18X1 1/2 (NEEDLE) ×2 IMPLANT
PACK EYE AFTER SURG (MISCELLANEOUS) ×2 IMPLANT
SYR 3ML LL SCALE MARK (SYRINGE) ×4 IMPLANT
SYR TB 1ML LUER SLIP (SYRINGE) ×2 IMPLANT
WATER STERILE IRR 250ML POUR (IV SOLUTION) ×2 IMPLANT
WIPE NON LINTING 3.25X3.25 (MISCELLANEOUS) ×2 IMPLANT

## 2021-04-05 NOTE — Anesthesia Postprocedure Evaluation (Signed)
Anesthesia Post Note  Patient: Valerie Prince  Procedure(s) Performed: CATARACT EXTRACTION PHACO AND INTRAOCULAR LENS PLACEMENT (IOC) LEFT HEALON 5 VISION BLUE 15.60 02:38.3 (Left: Eye)     Patient location during evaluation: PACU Anesthesia Type: MAC Level of consciousness: awake and alert and oriented Pain management: satisfactory to patient Vital Signs Assessment: post-procedure vital signs reviewed and stable Respiratory status: spontaneous breathing, nonlabored ventilation and respiratory function stable Cardiovascular status: blood pressure returned to baseline and stable Postop Assessment: Adequate PO intake and No signs of nausea or vomiting Anesthetic complications: no   No notable events documented.  Raliegh Ip

## 2021-04-05 NOTE — Op Note (Signed)
  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  Vision Blue dye was used to stain the lens capsule.  LENS:   Implant Name Type Inv. Item Serial No. Manufacturer Lot No. LRB No. Used Action  LENS IOL TECNIS EYHANCE 22.5 - [id number] Intraocular Lens LENS IOL TECNIS EYHANCE 22.5 [id number] [id number]   Left 1 Implanted       ULTRASOUND TIME:  2 minutes 38 seconds, CDE 15.6  SURGEON:  Wyonia Hough, MD   ANESTHESIA:  Topical with tetracaine drops and 2% Xylocaine jelly, augmented with 1% preservative-free intracameral lidocaine.       COMPLICATIONS:  partial detachment of Descemet's membrane   DESCRIPTION OF PROCEDURE:  The patient was identified in the holding room and transported to the operating room and placed in the supine position under the operating microscope.  The left eye was identified as the operative eye and it was prepped and draped in the usual sterile ophthalmic fashion.    A 1 millimeter clear-corneal paracentesis was made at the 1:30 position.  0.5 ml of preservative-free 1% lidocaine was injected into the anterior chamber. The anterior chamber was filled with Healon 5 viscoelastic.  A 2.4 millimeter keratome was used to make a near-clear corneal incision at the 10:30 position.  The anterior chamber was filled with Healon 5 viscoelastic.  Vision Blue dye was then injected under the viscoelastic to stain the lens capsule.  BSS was then used to wash the dye out.  Additional Healon 5 was placed into the anterior chamber.  A curvilinear capsulorrhexis was made with a cystotome and capsulorrhexis forceps.  Balanced salt solution was used to hydrodissect and  hydrodelineate the nucleus.  Viscoat was then placed in the anterior chamber.   Phacoemulsification was then used in stop and chop fashion to remove the lens nucleus and epinucleus.  The remaining cortex was then removed using the irrigation and aspiration handpiece. Provisc was then placed into the capsular bag to distend it for lens placement.  A 22.5 -diopter lens was then injected into the capsular bag.  The remaining viscoelastic was aspirated.   Wounds were hydrated with balanced salt solution. A shallow detachment of Descemet's membrane was noted centrally after hydration. The anterior chamber was inflated to a physiologic pressure with balanced salt solution. Cefuroxime 0.1 ml of a '10mg'$ /ml solution was injected into the anterior chamber for a dose of 1 mg of intracameral antibiotic at the completion of the case.  No wound leaks were noted.  Topical Combigan drops were applied to the eye.  The patient was taken to the recovery room in stable condition without complications of anesthesia or surgery.  Harmonii Karle 04/05/2021, 10:14 AM

## 2021-04-05 NOTE — Transfer of Care (Signed)
Immediate Anesthesia Transfer of Care Note  Patient: Valerie Prince  Procedure(s) Performed: CATARACT EXTRACTION PHACO AND INTRAOCULAR LENS PLACEMENT (IOC) LEFT HEALON 5 VISION BLUE 15.60 02:38.3 (Left: Eye)  Patient Location: PACU  Anesthesia Type: MAC  Level of Consciousness: awake, alert  and patient cooperative  Airway and Oxygen Therapy: Patient Spontanous Breathing and Patient connected to supplemental oxygen  Post-op Assessment: Post-op Vital signs reviewed, Patient's Cardiovascular Status Stable, Respiratory Function Stable, Patent Airway and No signs of Nausea or vomiting  Post-op Vital Signs: Reviewed and stable  Complications: No notable events documented.

## 2021-04-05 NOTE — Anesthesia Preprocedure Evaluation (Signed)
Anesthesia Evaluation  Patient identified by MRN, date of birth, ID band Patient awake    Reviewed: Allergy & Precautions, H&P , NPO status , Patient's Chart, lab work & pertinent test results  Airway Mallampati: II  TM Distance: >3 FB Neck ROM: full    Dental  (+) Upper Dentures, Partial Lower   Pulmonary former smoker,    Pulmonary exam normal breath sounds clear to auscultation       Cardiovascular hypertension, Normal cardiovascular exam Rhythm:regular Rate:Normal     Neuro/Psych    GI/Hepatic   Endo/Other  diabetes  Renal/GU      Musculoskeletal   Abdominal   Peds  Hematology   Anesthesia Other Findings   Reproductive/Obstetrics                             Anesthesia Physical Anesthesia Plan  ASA: 2  Anesthesia Plan: MAC   Post-op Pain Management:    Induction:   PONV Risk Score and Plan: 2 and Treatment may vary due to age or medical condition, TIVA and Midazolam  Airway Management Planned:   Additional Equipment:   Intra-op Plan:   Post-operative Plan:   Informed Consent: I have reviewed the patients History and Physical, chart, labs and discussed the procedure including the risks, benefits and alternatives for the proposed anesthesia with the patient or authorized representative who has indicated his/her understanding and acceptance.     Dental Advisory Given  Plan Discussed with: CRNA  Anesthesia Plan Comments:         Anesthesia Quick Evaluation

## 2021-04-05 NOTE — Anesthesia Procedure Notes (Signed)
Procedure Name: MAC Date/Time: 04/05/2021 9:48 AM Performed by: Mayme Genta, CRNA Pre-anesthesia Checklist: Patient identified, Emergency Drugs available, Suction available, Timeout performed and Patient being monitored Patient Re-evaluated:Patient Re-evaluated prior to induction Oxygen Delivery Method: Nasal cannula Placement Confirmation: positive ETCO2

## 2021-04-05 NOTE — H&P (Signed)
[facility]   Primary Care Physician:  Glean Hess, MD Ophthalmologist: Dr. Leandrew Koyanagi  Pre-Procedure History & Physical: HPI:  Valerie Prince is a 70 y.o. female here for ophthalmic surgery.   Past Medical History:  Diagnosis Date   Benign neoplasm of cecum    Benign neoplasm of sigmoid colon    Diabetes mellitus without complication ([place])    pt denies   Hyperlipidemia    Hypertension    pt denies   Pinched nerve in neck    Right leg pain    intermittent   Special screening for malignant neoplasms, colon    Wears dentures    upper partial    Past Surgical History:  Procedure Laterality Date   ABDOMINAL HYSTERECTOMY     COLONOSCOPY WITH PROPOFOL N/A 05/17/2016   Procedure: COLONOSCOPY WITH PROPOFOL;  Surgeon: [name], MD;  Location: [place];  Service: Endoscopy;  Laterality: N/A;  PLEASE KEEP PT ARRIVAL TIME 10 OR AFTER   POLYPECTOMY  05/17/2016   Procedure: POLYPECTOMY;  Surgeon: [name], MD;  Location: [place];  Service: Endoscopy;;   ROTATOR CUFF REPAIR Left 2012    Prior to Admission medications   Medication Sig Start Date End Date Taking? Authorizing Provider  cholecalciferol (VITAMIN D) 1000 units tablet Take 125 Units by mouth daily.   Yes [provider]  Flaxseed, Linseed, (FLAXSEED OIL PO) Take by mouth.   Yes [provider]  omega-3 fish oil (MAXEPA) 1000 MG CAPS capsule Take by mouth.   Yes [provider]  pioglitazone (ACTOS) 15 MG tablet Take 1 tablet (15 mg total) by mouth daily. 10/02/20  Yes Glean Hess, MD  psyllium (METAMUCIL) 58.6 % powder Take 1 packet by mouth 3 (three) times daily.   Yes [provider]  vitamin B-12 (CYANOCOBALAMIN) 1000 MCG tablet Take 1,000 mcg by mouth daily.   Yes [provider]  vitamin E 200 UNIT capsule Take 200 Units by mouth daily.   Yes [provider]  BIOTIN PO Take by mouth. Twice a week. 1029m  tablet Patient not taking: Reported on 09/30/2020    [provider]  blood glucose meter kit and supplies KIT Dispense based on patient and insurance preference. Test BS twice a day. 09/30/20   BGlean Hess MD    Allergies as of 03/15/2021 - Review Complete 09/30/2020  Allergen Reaction Noted   Codeine Nausea And Vomiting 03/21/2016    Family History  Problem Relation Age of Onset   Diabetes Sister    Stroke Father    Hypertension Sister     Social History   Socioeconomic History   Marital status: Single    Spouse name: Not on file   Number of children: 0   Years of education: Not on file   Highest education level: Not on file  Occupational History   Not on file  Tobacco Use   Smoking status: Former    Packs/day: 0.50    Years: 32.00    Pack years: 16.00    Types: Cigarettes    Quit date: 03/20/2005    Years since quitting: 16.0   Smokeless tobacco: Never  Vaping Use   Vaping Use: Never used  Substance and Sexual Activity   Alcohol use: Yes    Comment: 3 x year - wine   Drug use: Never   Sexual activity: Not Currently  Other Topics Concern   Not on file  Social History Narrative  Not on file   Social Determinants of Health   Financial Resource Strain: Low Risk    Difficulty of Paying Living Expenses: Not hard at all  Food Insecurity: No Food Insecurity   Worried About Charity fundraiser in the Last Year: Never true   [place] in the Last Year: Never true  Transportation Needs: No Transportation Needs   Lack of Transportation (Medical): No   Lack of Transportation (Non-Medical): No  Physical Activity: Inactive   Days of Exercise per Week: 0 days   Minutes of Exercise per Session: 0 min  Stress: No Stress Concern Present   Feeling of Stress : Not at all  Social Connections: Unknown   Frequency of Communication with Friends and Family: Patient refused   Frequency of Social Gatherings with Friends and Family: Patient refused   Attends  Religious Services: Patient refused   Marine scientist or Organizations: Patient refused   Attends Music therapist: Patient refused   Marital Status: Never married  Human resources officer Violence: Not At Risk   Fear of Current or Ex-Partner: No   Emotionally Abused: No   Physically Abused: No   Sexually Abused: No    Review of Systems: See HPI, otherwise negative ROS  Physical Exam: BP (!) 158/80   Pulse (!) 101   Temp (!) 97.4 F (36.3 C) (Temporal)   Wt 74.8 kg   SpO2 98%   BMI 29.10 kg/m  General:   Alert,  pleasant and cooperative in NAD Head:  Normocephalic and atraumatic. Lungs:  Clear to auscultation.    Heart:  Regular rate and rhythm.   Impression/Plan: Valerie Prince is here for ophthalmic surgery.  Risks, benefits, limitations, and alternatives regarding ophthalmic surgery have been reviewed with the patient.  Questions have been answered.  All parties agreeable.   Leandrew Koyanagi, MD  04/05/2021, 8:46 AM

## 2021-04-06 ENCOUNTER — Encounter: Payer: Self-pay | Admitting: Ophthalmology

## 2021-04-07 ENCOUNTER — Ambulatory Visit: Payer: Medicare Other | Admitting: Internal Medicine

## 2021-04-07 NOTE — Progress Notes (Deleted)
Date:  04/07/2021   Name:  Valerie Prince   DOB:  June 15, 1951   MRN:  [id number]   Chief Complaint: No chief complaint on file.  Diabetes She presents for her follow-up diabetic visit. She has type 2 diabetes mellitus. Her disease course has been worsening (Pt denies being a diabetic and refuses all medications). Pertinent negatives for hypoglycemia include no headaches or tremors. Pertinent negatives for diabetes include no chest pain, no fatigue, no polydipsia and no polyuria. Current diabetic treatment includes oral agent (monotherapy) (Actos). She is compliant with treatment most of the time. She is following a generally healthy diet. An ACE inhibitor/angiotensin II receptor blocker is not being taken. Eye exam is current.  Hypertension This is a recurrent problem. The problem is uncontrolled. Pertinent negatives include no chest pain, headaches, palpitations or shortness of breath. Treatments tried: no current medications.   Lab Results  Component Value Date   CREATININE 0.93 09/30/2020   BUN 11 09/30/2020   NA 139 09/30/2020   K 4.3 09/30/2020   CL 100 09/30/2020   CO2 21 09/30/2020   Lab Results  Component Value Date   CHOL 364 (H) 03/09/2020   HDL 56 03/09/2020   LDLCALC 257 (H) 03/09/2020   TRIG 244 (H) 03/09/2020   CHOLHDL 6.5 (H) 03/09/2020   Lab Results  Component Value Date   TSH 1.300 03/09/2020   Lab Results  Component Value Date   HGBA1C 10.5 (H) 09/30/2020   Lab Results  Component Value Date   WBC 7.9 03/09/2020   HGB 14.4 03/09/2020   HCT 41.8 03/09/2020   MCV 87 03/09/2020   PLT 309 03/09/2020   Lab Results  Component Value Date   ALT 25 03/09/2020   AST 21 03/09/2020   ALKPHOS 101 03/09/2020   BILITOT 0.6 03/09/2020     Review of Systems  Constitutional:  Negative for appetite change, fatigue, fever and unexpected weight change.  HENT:  Negative for tinnitus and trouble swallowing.   Eyes:  Negative for visual disturbance.  Respiratory:   Negative for cough, chest tightness and shortness of breath.   Cardiovascular:  Negative for chest pain, palpitations and leg swelling.  Gastrointestinal:  Negative for abdominal pain.  Endocrine: Negative for polydipsia and polyuria.  Genitourinary:  Negative for dysuria and hematuria.  Musculoskeletal:  Negative for arthralgias.  Neurological:  Negative for tremors, numbness and headaches.  Psychiatric/Behavioral:  Negative for dysphoric mood.    Patient Active Problem List   Diagnosis Date Noted   Lumbar spondylosis 03/09/2020   Carpal tunnel syndrome 03/08/2020   Cervical radiculopathy 03/08/2020   Edema 03/08/2020   Localized, primary osteoarthritis of shoulder region 03/08/2020   HTN (hypertension) 06/18/2018   Hyperlipidemia associated with type 2 diabetes mellitus ([place]) 06/18/2018   Non-compliance 06/18/2018   Uncontrolled type 2 diabetes mellitus with hyperglycemia ([place]) 06/17/2018    Allergies  Allergen Reactions   Codeine Nausea And Vomiting   Prednisone     Steroids cause headaches     Past Surgical History:  Procedure Laterality Date   ABDOMINAL HYSTERECTOMY     CATARACT EXTRACTION W/PHACO Left 04/05/2021   Procedure: CATARACT EXTRACTION PHACO AND INTRAOCULAR LENS PLACEMENT ([place]) LEFT HEALON 5 VISION BLUE 15.60 02:38.3;  Surgeon: Leandrew Koyanagi, MD;  Location: [place];  Service: Ophthalmology;  Laterality: Left;   COLONOSCOPY WITH PROPOFOL N/A 05/17/2016   Procedure: COLONOSCOPY WITH PROPOFOL;  Surgeon: [name], MD;  Location: [place];  Service: Endoscopy;  Laterality: N/A;  PLEASE KEEP PT ARRIVAL TIME 10 OR AFTER   POLYPECTOMY  05/17/2016   Procedure: POLYPECTOMY;  Surgeon: [name], MD;  Location: [place];  Service: Endoscopy;;   ROTATOR CUFF REPAIR Left 2012    Social History   Tobacco Use   Smoking status: Former    Packs/day: 0.50    Years: 32.00    Pack years: 16.00    Types: Cigarettes    Quit date:  03/20/2005    Years since quitting: 16.0   Smokeless tobacco: Never  Vaping Use   Vaping Use: Never used  Substance Use Topics   Alcohol use: Yes    Comment: 3 x year - wine   Drug use: Never     Medication list has been reviewed and updated.  No outpatient medications have been marked as taking for the 04/07/21 encounter (Appointment) with Glean Hess, MD.    [facility] 2/9 Scores 09/30/2020 05/18/2020 03/09/2020 06/18/2018  PHQ - 2 Score 0 0 0 0  PHQ- 9 Score 0 - 0 0    GAD 7 : Generalized Anxiety Score 09/30/2020 03/09/2020  Nervous, Anxious, on Edge 0 0  Control/stop worrying 0 0  Worry too much - different things 0 0  Trouble relaxing 0 0  Restless 0 0  Easily annoyed or irritable 0 0  Afraid - awful might happen 0 0  Total GAD 7 Score 0 0  Anxiety Difficulty - Not difficult at all    BP Readings from Last 3 Encounters:  04/05/21 113/73  09/30/20 (!) 148/78  03/09/20 139/89    Physical Exam  Wt Readings from Last 3 Encounters:  04/05/21 164 lb 12.8 oz (74.8 kg)  09/30/20 170 lb (77.1 kg)  03/09/20 170 lb (77.1 kg)    There were no vitals taken for this visit.  Assessment and Plan:

## 2021-04-12 ENCOUNTER — Encounter: Payer: Self-pay | Admitting: Internal Medicine

## 2021-04-27 ENCOUNTER — Ambulatory Visit: Payer: Medicare Other | Admitting: Internal Medicine

## 2021-05-22 ENCOUNTER — Ambulatory Visit: Payer: Medicare Other

## 2024-04-17 NOTE — Progress Notes (Signed)
 [facility] Quality Team Note  Name: Valerie Prince Date of Birth: 18-Dec-1950 MRN: [id number] Date: 04/17/2024  [facility] Quality Team has reviewed this patient's chart, please see recommendations below:  [employer] Quality Other; (CHART REVIEWED FOR BREAST CANCER SCREENING, LAST MAMMOGRAM 2022- NOT COMPLIANT)

## 2024-08-07 ENCOUNTER — Telehealth: Payer: Self-pay

## 2024-08-07 NOTE — Telephone Encounter (Signed)
 Patient was identified as falling into the [employer] [place] Measure - Diabetes.   Patient was: Left voicemail to schedule with primary care provider. Patient has not been seen since 2022.
